# Patient Record
Sex: Female | Born: 1975
Health system: Southern US, Community
[De-identification: ages and names within clinical notes are randomized; demographics above are authoritative.]

## PROBLEM LIST (undated history)

## (undated) DIAGNOSIS — G4730 Sleep apnea, unspecified: Secondary | ICD-10-CM

## (undated) DIAGNOSIS — D649 Anemia, unspecified: Secondary | ICD-10-CM

## (undated) DIAGNOSIS — N390 Urinary tract infection, site not specified: Secondary | ICD-10-CM

## (undated) DIAGNOSIS — K5190 Ulcerative colitis, unspecified, without complications: Secondary | ICD-10-CM

## (undated) DIAGNOSIS — Z87898 Personal history of other specified conditions: Secondary | ICD-10-CM

## (undated) DIAGNOSIS — R030 Elevated blood-pressure reading, without diagnosis of hypertension: Secondary | ICD-10-CM

## (undated) DIAGNOSIS — I10 Essential (primary) hypertension: Secondary | ICD-10-CM

## (undated) DIAGNOSIS — M940 Chondrocostal junction syndrome [Tietze]: Secondary | ICD-10-CM

## (undated) DIAGNOSIS — K635 Polyp of colon: Secondary | ICD-10-CM

## (undated) DIAGNOSIS — F419 Anxiety disorder, unspecified: Secondary | ICD-10-CM

## (undated) HISTORY — PX: TUBAL LIGATION: SHX77

## (undated) HISTORY — DX: Anxiety disorder, unspecified: F41.9

## (undated) HISTORY — PX: COLONOSCOPY W/ POLYPECTOMY: SHX1380

## (undated) HISTORY — DX: Personal history of other specified conditions: Z87.898

## (undated) HISTORY — DX: Essential (primary) hypertension: I10

## (undated) HISTORY — DX: Urinary tract infection, site not specified: N39.0

## (undated) HISTORY — DX: Elevated blood-pressure reading, without diagnosis of hypertension: R03.0

## (undated) HISTORY — DX: Chondrocostal junction syndrome (tietze): M94.0

## (undated) HISTORY — DX: Polyp of colon: K63.5

## (undated) HISTORY — DX: Ulcerative colitis, unspecified, without complications: K51.90

---

## 2008-06-26 HISTORY — PX: FOOT SURGERY: SHX648

## 2015-07-01 ENCOUNTER — Encounter: Payer: Self-pay | Admitting: Family Medicine

## 2015-07-01 ENCOUNTER — Ambulatory Visit (INDEPENDENT_AMBULATORY_CARE_PROVIDER_SITE_OTHER): Payer: 59 | Admitting: Family Medicine

## 2015-07-01 VITALS — BP 170/100 | HR 68 | Temp 98.5°F | Wt 251.2 lb

## 2015-07-01 DIAGNOSIS — E669 Obesity, unspecified: Secondary | ICD-10-CM | POA: Diagnosis not present

## 2015-07-01 DIAGNOSIS — J0140 Acute pansinusitis, unspecified: Secondary | ICD-10-CM

## 2015-07-01 DIAGNOSIS — R030 Elevated blood-pressure reading, without diagnosis of hypertension: Secondary | ICD-10-CM

## 2015-07-01 DIAGNOSIS — IMO0001 Reserved for inherently not codable concepts without codable children: Secondary | ICD-10-CM

## 2015-07-01 MED ORDER — AMOXICILLIN 875 MG PO TABS: 875.0000 mg | ORAL_TABLET | Freq: Two times a day (BID) | ORAL | Status: DC

## 2015-07-01 NOTE — Progress Notes (Signed)
   Subjective:    Patient ID: Tracy Holt, female    DOB: 1975/11/18, 40 y.o.   MRN: [id number]  HPI Chief Complaint  Patient presents with  . sinus infection    sinus infection, headache, coughing, nose stopped up. sinus pressure   She is new to the practice and here for an acute visit.  She is here with complaints of low grade fever, frontal headache, sinus pressure and congestion, post nasal drainage for past 3-4 days. States she has tried OTC medications without relief.   States she has had a history of elevated blood pressures but never diagnosed or prescribed medication. She states she also checks her blood pressure outside of the doctor office and it is usually 160/90.  Denies ear pain, sore throat, chest pain, DOE, nausea, vomiting, diarrhea.   She states she lives in [facility] which is a 2 Hour drive from here and that she stays here during the week for work. She reports unhealthy eating due to travel and that she is an "emotional eater". She also does not currently exercise. States she has gained approximately 60 lbs in past 2 years since working in her new job. She wants to lose weight.  Has a husband and 2 children at home in [place].   Review of Systems Pertinent positives and negatives in the history of present illness.    Objective:   Physical Exam BP 170/100 mmHg  Pulse 68  Temp(Src) 98.5 F (36.9 C) (Oral)  Wt 251 lb 3.2 oz (113.944 kg)  Alert and in no distress. Mild frontal and maxillary sinus tenderness. Nares red. Tympanic membranes and canals are normal. Pharyngeal area is erythematous without edema or exudate Neck is supple without adenopathy or thyromegaly. Cardiac exam shows a regular sinus rhythm without murmurs or gallops. Lungs are clear to auscultation.      Assessment & Plan:  Acute pansinusitis, recurrence not specified - Plan: amoxicillin (AMOXIL) 875 MG tablet  Elevated blood pressure  She will return next week for blood pressure  check when she is no longer taking cold medication. DASH diet discussed as well as information on MyFitnessPal. Discussed lifestyle modifications including healthy eating and physical activity. Discussed that if her blood pressure remains elevated next week that we will need to start pharmacotherapy.  Discussed that she should make healthy food choices when eating fast food. She is on the road often and eating fast food. Discussed eating grilled foods and salads.  Suspect her URI symptoms are related to sinus infection. Discussed symptomatic treatment minus decongestants due to elevated blood pressure as well as prescribed amoxicillin. She will let me know if not improving or if she gets worse.

## 2015-07-01 NOTE — Patient Instructions (Signed)
My Fitness Pal is a free app.  Come by one day next week and have your blood pressure checked by the nurse. We will discuss the pressure medication if needed at that point.  DASH Eating Plan DASH stands for "Dietary Approaches to Stop Hypertension." The DASH eating plan is a healthy eating plan that has been shown to reduce high blood pressure (hypertension). Additional health benefits may include reducing the risk of type 2 diabetes mellitus, heart disease, and stroke. The DASH eating plan may also help with weight loss. WHAT DO I NEED TO KNOW ABOUT THE DASH EATING PLAN? For the DASH eating plan, you will follow these general guidelines:  Choose foods with a percent daily value for sodium of less than 5% (as listed on the food label).  Use salt-free seasonings or herbs instead of table salt or sea salt.  Check with your health care provider or pharmacist before using salt substitutes.  Eat lower-sodium products, often labeled as "lower sodium" or "no salt added."  Eat fresh foods.  Eat more vegetables, fruits, and low-fat dairy products.  Choose whole grains. Look for the word "whole" as the first word in the ingredient list.  Choose fish and skinless chicken or [place] more often than red meat. Limit fish, poultry, and meat to 6 oz (170 g) each day.  Limit sweets, desserts, sugars, and sugary drinks.  Choose heart-healthy fats.  Limit cheese to 1 oz (28 g) per day.  Eat more home-cooked food and less restaurant, buffet, and fast food.  Limit fried foods.  Cook foods using methods other than frying.  Limit canned vegetables. If you do use them, rinse them well to decrease the sodium.  When eating at a restaurant, ask that your food be prepared with less salt, or no salt if possible. WHAT FOODS CAN I EAT? Seek help from a dietitian for individual calorie needs. Grains Whole grain or whole wheat bread. Brown rice. Whole grain or whole wheat pasta. Quinoa, bulgur, and whole  grain cereals. Low-sodium cereals. Corn or whole wheat flour tortillas. Whole grain cornbread. Whole grain crackers. Low-sodium crackers. Vegetables Fresh or frozen vegetables (raw, steamed, roasted, or grilled). Low-sodium or reduced-sodium tomato and vegetable juices. Low-sodium or reduced-sodium tomato sauce and paste. Low-sodium or reduced-sodium canned vegetables.  Fruits All fresh, canned (in natural juice), or frozen fruits. Meat and Other Protein Products Ground beef (85% or leaner), grass-fed beef, or beef trimmed of fat. Skinless chicken or [place]. Ground chicken or [place]. Pork trimmed of fat. All fish and seafood. Eggs. Dried beans, peas, or lentils. Unsalted nuts and seeds. Unsalted canned beans. Dairy Low-fat dairy products, such as skim or 1% milk, 2% or reduced-fat cheeses, low-fat ricotta or cottage cheese, or plain low-fat yogurt. Low-sodium or reduced-sodium cheeses. Fats and Oils Tub margarines without trans fats. Light or reduced-fat mayonnaise and salad dressings (reduced sodium). Avocado. Safflower, olive, or canola oils. Natural peanut or almond butter. Other Unsalted popcorn and pretzels. The items listed above may not be a complete list of recommended foods or beverages. Contact your dietitian for more options. WHAT FOODS ARE NOT RECOMMENDED? Grains White bread. White pasta. White rice. Refined cornbread. Bagels and croissants. Crackers that contain trans fat. Vegetables Creamed or fried vegetables. Vegetables in a cheese sauce. Regular canned vegetables. Regular canned tomato sauce and paste. Regular tomato and vegetable juices. Fruits Dried fruits. Canned fruit in light or heavy syrup. Fruit juice. Meat and Other Protein Products Fatty cuts of meat. Ribs, chicken wings, bacon,  sausage, bologna, salami, chitterlings, fatback, hot dogs, bratwurst, and packaged luncheon meats. Salted nuts and seeds. Canned beans with salt. Dairy Whole or 2% milk, cream, half-and-half,  and cream cheese. Whole-fat or sweetened yogurt. Full-fat cheeses or blue cheese. Nondairy creamers and whipped toppings. Processed cheese, cheese spreads, or cheese curds. Condiments Onion and garlic salt, seasoned salt, table salt, and sea salt. Canned and packaged gravies. Worcestershire sauce. Tartar sauce. Barbecue sauce. Teriyaki sauce. Soy sauce, including reduced sodium. Steak sauce. Fish sauce. Oyster sauce. Cocktail sauce. Horseradish. Ketchup and mustard. Meat flavorings and tenderizers. Bouillon cubes. Hot sauce. Tabasco sauce. Marinades. Taco seasonings. Relishes. Fats and Oils Butter, stick margarine, lard, shortening, ghee, and bacon fat. Coconut, palm kernel, or palm oils. Regular salad dressings. Other Pickles and olives. Salted popcorn and pretzels. The items listed above may not be a complete list of foods and beverages to avoid. Contact your dietitian for more information. WHERE CAN I FIND MORE INFORMATION? National Heart, Lung, and Blood Institute: travelstabloid.com   This information is not intended to replace advice given to you by your health care provider. Make sure you discuss any questions you have with your health care provider.   Document Released: 06/01/2011 Document Revised: 07/03/2014 Document Reviewed: 04/16/2013 Elsevier Interactive Patient Education [employer].

## 2015-07-07 ENCOUNTER — Other Ambulatory Visit: Payer: 59

## 2015-07-07 ENCOUNTER — Encounter: Payer: Self-pay | Admitting: Family Medicine

## 2015-07-07 ENCOUNTER — Ambulatory Visit (INDEPENDENT_AMBULATORY_CARE_PROVIDER_SITE_OTHER): Payer: 59 | Admitting: Family Medicine

## 2015-07-07 VITALS — BP 132/94 | Ht 66.25 in | Wt 250.0 lb

## 2015-07-07 DIAGNOSIS — R350 Frequency of micturition: Secondary | ICD-10-CM | POA: Diagnosis not present

## 2015-07-07 DIAGNOSIS — R030 Elevated blood-pressure reading, without diagnosis of hypertension: Secondary | ICD-10-CM | POA: Diagnosis not present

## 2015-07-07 DIAGNOSIS — Z833 Family history of diabetes mellitus: Secondary | ICD-10-CM | POA: Diagnosis not present

## 2015-07-07 DIAGNOSIS — IMO0001 Reserved for inherently not codable concepts without codable children: Secondary | ICD-10-CM

## 2015-07-07 LAB — POCT URINALYSIS DIPSTICK
Bilirubin, UA: NEGATIVE
GLUCOSE UA: NEGATIVE
KETONES UA: NEGATIVE
Leukocytes, UA: NEGATIVE
Nitrite, UA: NEGATIVE
PROTEIN UA: NEGATIVE
RBC UA: NEGATIVE
UROBILINOGEN UA: NEGATIVE
pH, UA: 6

## 2015-07-07 LAB — COMPREHENSIVE METABOLIC PANEL
ALBUMIN: 4.2 g/dL (ref 3.6–5.1)
ALT: 19 U/L (ref 6–29)
AST: 18 U/L (ref 10–30)
Alkaline Phosphatase: 66 U/L (ref 33–115)
BILIRUBIN TOTAL: 0.5 mg/dL (ref 0.2–1.2)
BUN: 9 mg/dL (ref 7–25)
CO2: 27 mmol/L (ref 20–31)
CREATININE: 0.63 mg/dL (ref 0.50–1.10)
Calcium: 9.5 mg/dL (ref 8.6–10.2)
Chloride: 103 mmol/L (ref 98–110)
Glucose, Bld: 88 mg/dL (ref 65–99)
Potassium: 4.3 mmol/L (ref 3.5–5.3)
Sodium: 138 mmol/L (ref 135–146)
TOTAL PROTEIN: 7.2 g/dL (ref 6.1–8.1)

## 2015-07-07 LAB — CBC WITH DIFFERENTIAL/PLATELET
Basophils Absolute: 0 10*3/uL (ref 0.0–0.1)
Basophils Relative: 0 % (ref 0–1)
Eosinophils Absolute: 0.1 10*3/uL (ref 0.0–0.7)
Eosinophils Relative: 1 % (ref 0–5)
HEMATOCRIT: 41.9 % (ref 36.0–46.0)
HEMOGLOBIN: 14 g/dL (ref 12.0–15.0)
LYMPHS PCT: 32 % (ref 12–46)
Lymphs Abs: 3 10*3/uL (ref 0.7–4.0)
MCH: 29 pg (ref 26.0–34.0)
MCHC: 33.4 g/dL (ref 30.0–36.0)
MCV: 86.9 fL (ref 78.0–100.0)
MONO ABS: 0.6 10*3/uL (ref 0.1–1.0)
MONOS PCT: 6 % (ref 3–12)
MPV: 10.8 fL (ref 8.6–12.4)
NEUTROS ABS: 5.7 10*3/uL (ref 1.7–7.7)
Neutrophils Relative %: 61 % (ref 43–77)
Platelets: 290 10*3/uL (ref 150–400)
RBC: 4.82 MIL/uL (ref 3.87–5.11)
RDW: 13.8 % (ref 11.5–15.5)
WBC: 9.3 10*3/uL (ref 4.0–10.5)

## 2015-07-07 NOTE — Progress Notes (Signed)
   Subjective:    Patient ID: Tracy Holt, female    DOB: Mar 29, 1976, 40 y.o.   MRN: [id number]  HPI She was here initially for a nurse visit for follow up blood pressure check and her BP was elevated at 132/94.  Visit changed to appointment with me to further evaluation. Her blood pressure was elevated at her appointment last week. She has been checking her blood pressure outside of here for the past few days and numbers have been 165/100, 167/104, 163/98. She does not have a history of hypertension. No medical records. She cannot recall last physical exam and blood work.  Complains of urinary frequency for past several weeks, voiding every hour while awake. Denies dysuria, hematuria, nocturia.  She has family history, both parents and grandparents with type 2 diabetes.  She denies dizziness, headaches, visual changes, chest pain, palpitations, DOE, orthopnea, edema, or GI issues. Reports she is under a lot of stress with work and that she stays here in an apartment due to her job and then drives home to [facility] on the weekend to see her family. She has been doing this for a couple of years and in the foreseeable future does not have a plan to move here permanently or to change jobs. She states she does not exercise and does not watch what she eats.   Information for DASH diet was given to her last week, she states she has not paid attention to her food intake.  Review of Systems Pertinent positives and negatives in the history of present illness.    Objective:   Physical Exam BP 132/94 mmHg  Ht 5' 6.25" (1.683 m)  Wt 250 lb (113.399 kg)  BMI 40.04 kg/m2  Alert and oriented and in no acute distress.  EKG performed due to elevated blood pressure. NSR, unremarkable. Read by Dr. Redmond School. Urinalysis dipstick: Negative    Assessment & Plan:  Elevated blood pressure - Plan: EKG 12-Lead, CBC with Differential/Platelet, Comprehensive metabolic panel  Family history of diabetes  mellitus in first degree relative - Plan: Comprehensive metabolic panel  Frequency of urination - Plan: CBC with Differential/Platelet, Comprehensive metabolic panel, POCT urinalysis dipstick  Morbid obesity, unspecified obesity type ([place])  Discussed lifestyle modifications such as being more physically active even 5-10 minutes at a time can be beneficial. Also discussed eating a low-sodium diet and making healthy food choices such as grilled, baked, salads. Her EKG today was unremarkable and was read by me and Dr. Redmond School. Will check blood work and urinalysis due to recent urinary frequency. No evidence of target end organ damage at this point. Discussed lifestyle changes to improve obesity and blood pressure. She has significant family history for diabetes.  Discussed that standard of care to diagnose hypertension would be to bring her back in 3 weeks and repeat blood pressure. At that point, if her blood pressure remains elevated, she will need to start medication.

## 2015-07-07 NOTE — Patient Instructions (Signed)
I recommend being more physically active. Walking 5-10 minutes at a time a couple times per day would be beneficial. Make healthy food choices such as baked, grilled or salads. No more than 2,400 mg of sodium per day. These life style modifications can help reduce blood pressure. Please return in 3 weeks for blood pressure follow-up.

## 2015-07-28 ENCOUNTER — Encounter: Payer: Self-pay | Admitting: Family Medicine

## 2015-07-28 ENCOUNTER — Ambulatory Visit (INDEPENDENT_AMBULATORY_CARE_PROVIDER_SITE_OTHER): Payer: 59 | Admitting: Family Medicine

## 2015-07-28 VITALS — BP 142/100 | HR 68 | Temp 98.4°F | Wt 248.8 lb

## 2015-07-28 DIAGNOSIS — I10 Essential (primary) hypertension: Secondary | ICD-10-CM

## 2015-07-28 DIAGNOSIS — J3489 Other specified disorders of nose and nasal sinuses: Secondary | ICD-10-CM | POA: Diagnosis not present

## 2015-07-28 MED ORDER — ATENOLOL 50 MG PO TABS: 50.0000 mg | ORAL_TABLET | Freq: Every day | ORAL | Status: DC

## 2015-07-28 NOTE — Progress Notes (Signed)
   Subjective:    Patient ID: Tracy Holt, female    DOB: 11/22/1975, 40 y.o.   MRN: [id number]  HPI Chief Complaint  Patient presents with  . follow-up on bp    follow-up on bp. highest bp was 181/107, lowest bp 150/98 (monday)  . sick    sick for 2 days, running noses, sneezing, congestions, runny eyes.    She is here for follow-up on blood pressure. She reports taking her blood pressures at home since last visit and readings have been between 150/98 and 181/107. She reports eating low salt and healthier for past couple of weeks. She has started being more physically active in past week. She denies fever, chills, headaches, dizziness, chest pain, palpitations, DOE, LE edema. No plans for pregnancy, tubes are tied.  She states she is aware that she needs medication for blood pressure. Her goal is to eventually get off medication by modifying lifestyle.  States she already urinates often and does not want to be on a "fluid pill".   She also complains of runny nose and sneezing for past 2 days. Denies cough, sore throat, ear pain or sinus pressure.    Review of Systems Pertinent positives and negatives in the history of present illness.     Objective:   Physical Exam BP 142/100 mmHg  Pulse 68  Temp(Src) 98.4 F (36.9 C) (Oral)  Wt 248 lb 12.8 oz (112.855 kg)  Alert and in no distress. No sinus tenderness. Nares red, edematous and clear discharge. Tympanic membranes and canals are normal. Pharyngeal area is normal. Neck is supple without adenopathy.  Cardiac exam shows a regular sinus rhythm without murmurs or gallops. Lungs are clear to auscultation.  EKG and labs on file, baseline obtained at previous visit.      Assessment & Plan:  Essential hypertension - Plan: atenolol (TENORMIN) 50 MG tablet  Rhinorrhea  Discussed continuing lifestyle modifications such as healthy diet and increasing physical activity.  Atenolol 50 mg prescribed. Start by taking 25 mg, half tablet,  for first week, check blood pressures and pulse at home and let me know if pulse is below 60 or if she is having symptoms such as lightheadedness or severe fatigue. Advised her to avoid taking her first few doses and then driving afterwards.  Discussed that this medication must be tapered and not stopped abruptly going forward.  Rhinorrhea is most likely a viral etiology or possibly allergies, recommend trying an antihistamine such as claritin. She will let me know if her symptoms worsen.

## 2015-07-28 NOTE — Patient Instructions (Addendum)
Take 1/2 tab (25 mg) daily for first week and keep check on your blood pressure and heart rate. Let me know if you heart rate is dropping lower than 60 or if you feel lightheaded or symptomatic with the medication. We will increase the dose to the full tablet (50 mg) if you are doing well on the medication.  Return for blood pressure check in our office in 7-14 days.  Continue diet and exercise as discussed.    Atenolol tablets What is this medicine? ATENOLOL (a TEN oh lole) is a beta-blocker. Beta-blockers reduce the workload on the heart and help it to beat more regularly. This medicine is used to treat high blood pressure and to prevent chest pain. It is also used to protect the heart during a heart attack and to prevent an additional heart attack from occurring. This medicine may be used for other purposes; ask your health care provider or pharmacist if you have questions. What should I tell my health care provider before I take this medicine? They need to know if you have any of these conditions: -diabetes -heart or vessel disease like slow heart rate, worsening heart failure, heart block, sick sinus syndrome or Raynaud's disease -kidney disease -lung or breathing disease, like asthma or emphysema -pheochromocytoma -thyroid disease -an unusual or allergic reaction to atenolol, other beta-blockers, medicines, foods, dyes, or preservatives -pregnant or trying to get pregnant -breast-feeding How should I use this medicine? Take this medicine by mouth with a drink of water. Follow the directions on the prescription label. This medicine may be taken with or without food. Take your medicine at regular intervals. Do not take more medicine than directed. Do not stop taking this medicine suddenly. This could lead to serious heart-related effects. Talk to your pediatrician regarding the use of this medicine in children. Special care may be needed. Overdosage: If you think you have taken too much of  this medicine contact a poison control center or emergency room at once. NOTE: This medicine is only for you. Do not share this medicine with others. What if I miss a dose? If you miss a dose, take it as soon as you can. If it is almost time for your next dose, take only that dose. Do not take double or extra doses. What may interact with this medicine? This medicine may interact with the following medications: -certain medicines for blood pressure, heart disease, irregular heart beat -clonidine -digoxin -diuretics -dobutamine -epinephrine -isoproterenol -NSAIDs, medicines for pain and inflammation, like ibuprofen or naproxen -reserpine This list may not describe all possible interactions. Give your health care provider a list of all the medicines, herbs, non-prescription drugs, or dietary supplements you use. Also tell them if you smoke, drink alcohol, or use illegal drugs. Some items may interact with your medicine. What should I watch for while using this medicine? Visit your doctor or health care professional for regular check ups. Check your blood pressure and pulse rate regularly. Ask your health care professional what your blood pressure and pulse rate should be, and when you should contact him or her. You may get drowsy or dizzy. Do not drive, use machinery, or do anything that needs mental alertness until you know how this medicine affects you. Do not stand or sit up quickly. Alcohol may interfere with the effect of this medicine. Avoid alcoholic drinks. This medicine can affect blood sugar levels. If you have diabetes, check with your doctor or health care professional before you change your diet or  the dose of your diabetic medicine. Do not treat yourself for coughs, colds, or pain while you are taking this medicine without asking your doctor or health care professional for advice. Some ingredients may increase your blood pressure. What side effects may I notice from receiving this  medicine? Side effects that you should report to your doctor or health care professional as soon as possible: -allergic reactions like skin rash, itching or hives, swelling of the face, lips, or tongue -breathing problems -changes in vision -chest pain -cold, tingling, or numb hands or feet -depression -fast, irregular heartbeat -feeling faint or lightheaded, falls -fever with sore throat -rapid weight gain -swollen ankles, legs Side effects that usually do not require medical attention (report to your doctor or health care professional if they continue or are bothersome): -anxiety, nervous -diarrhea -dry skin -change in sex drive or performance -headache -nightmares or trouble sleeping -short term memory loss -stomach upset -unusually tired This list may not describe all possible side effects. Call your doctor for medical advice about side effects. You may report side effects to FDA at 1-800-FDA-1088. Where should I keep my medicine? Keep out of the reach of children. Store at room temperature between 20 and 25 degrees C (68 and 77 degrees F). Close tightly and protect from light. Throw away any unused medicine after the expiration date. NOTE: This sheet is a summary. It may not cover all possible information. If you have questions about this medicine, talk to your doctor, pharmacist, or health care provider.    2016, Elsevier/Gold Standard. (2013-02-16 14:21:28)

## 2015-08-11 ENCOUNTER — Ambulatory Visit (INDEPENDENT_AMBULATORY_CARE_PROVIDER_SITE_OTHER): Payer: 59 | Admitting: Family Medicine

## 2015-08-11 ENCOUNTER — Encounter: Payer: Self-pay | Admitting: Family Medicine

## 2015-08-11 VITALS — BP 132/92 | HR 68 | Wt 247.4 lb

## 2015-08-11 DIAGNOSIS — I10 Essential (primary) hypertension: Secondary | ICD-10-CM | POA: Diagnosis not present

## 2015-08-11 MED ORDER — LISINOPRIL 10 MG PO TABS: 10.0000 mg | ORAL_TABLET | Freq: Every day | ORAL | Status: DC

## 2015-08-11 NOTE — Progress Notes (Signed)
   Subjective:    Patient ID: Tracy Holt, female    DOB: 02/02/1976, 40 y.o.   MRN: [id number]  HPI Chief Complaint  Patient presents with  . follow-up    follow-up on bp.still taking a half tablet. brings on alittle depression and aniexty, sleepniess. bp lowest- 135/75, bp highest- 149/87   She was seen for continued elevated blood pressure and started on beta blocker approximately 2 weeks ago. She is taking atenolol at 8pm in the evening, still taking 1/2 pill and did not increase dose after the first week due to having some side effects from the medication. She states her blood pressure has been from 135/and heart rate has been in 60s-80s on the medication Reports having trouble falling asleep, feeling blue and having depressive type symptoms. States she has not felt depressed in many years. Also reports dry mouth and feeling some anxiety in public. She is interested in switching medication.  She does not want to take a diuretic, she has to drive often for work.  No plans for pregnancy, has had tubal ligation.  Denies chest pain, palpitations, DOE or LE edema.  States she has made some mild changes to her diet such as eating more grilled food and salads when she can. Has not started exercising as of yet.   No physical exam in 5 years and would like TSH checked due to mother having history of Graves disease. She states she is not having any skin, hair or bowel changes.   Review of Systems Pertinent positives and negatives in the history of present illness.     Objective:   Physical Exam BP 132/92 mmHg  Pulse 68  Wt 247 lb 6.4 oz (112.22 kg) Alert and in no distress. Cardiac exam shows a regular sinus rhythm without murmurs or gallops. Lungs are clear to auscultation.     Assessment & Plan:  Essential hypertension - Plan: lisinopril (PRINIVIL,ZESTRIL) 10 MG tablet  Discussed patient with Dr. Tomi Bamberger. Since patient is having significant side effects, I recommend that we switch her  medication. No tapering needed since patient has only been taking 23m of Atenolol for 2 weeks. She took atenolol this morning before appointment. Recommend stopping atenolol and start Lisinopril 10 mg tomorrow. Discussed possible side effects and to report is she is having palpitations, cough. Also discussed seeking emergency medial care if she has facial or lip edema which is rare.  Discussed again the Dash diet and starting to exercise. She will return in 2 weeks for a nurse visit to have her blood pressure checked here in the office. She will also continue checking her blood pressure at home and report if her readings are greater than 130/90. Also encouraged her to call [place] she is experiencing side effects that she thinks is caused by the medication. She has not had a physical exam and fasting blood work in several years and I recommend that she schedule this in the near future.

## 2015-08-11 NOTE — Patient Instructions (Signed)
DASH Eating Plan  DASH stands for "Dietary Approaches to Stop Hypertension." The DASH eating plan is a healthy eating plan that has been shown to reduce high blood pressure (hypertension). Additional health benefits may include reducing the risk of type 2 diabetes mellitus, heart disease, and stroke. The DASH eating plan may also help with weight loss.  WHAT DO I NEED TO KNOW ABOUT THE DASH EATING PLAN?  For the DASH eating plan, you will follow these general guidelines:  · Choose foods with a percent daily value for sodium of less than 5% (as listed on the food label).  · Use salt-free seasonings or herbs instead of table salt or sea salt.  · Check with your health care provider or pharmacist before using salt substitutes.  · Eat lower-sodium products, often labeled as "lower sodium" or "no salt added."  · Eat fresh foods.  · Eat more vegetables, fruits, and low-fat dairy products.  · Choose whole grains. Look for the word "whole" as the first word in the ingredient list.  · Choose fish and skinless chicken or turkey more often than red meat. Limit fish, poultry, and meat to 6 oz (170 g) each day.  · Limit sweets, desserts, sugars, and sugary drinks.  · Choose heart-healthy fats.  · Limit cheese to 1 oz (28 g) per day.  · Eat more home-cooked food and less restaurant, buffet, and fast food.  · Limit fried foods.  · Cook foods using methods other than frying.  · Limit canned vegetables. If you do use them, rinse them well to decrease the sodium.  · When eating at a restaurant, ask that your food be prepared with less salt, or no salt if possible.  WHAT FOODS CAN I EAT?  Seek help from a dietitian for individual calorie needs.  Grains  Whole grain or whole wheat bread. Brown rice. Whole grain or whole wheat pasta. Quinoa, bulgur, and whole grain cereals. Low-sodium cereals. Corn or whole wheat flour tortillas. Whole grain cornbread. Whole grain crackers. Low-sodium crackers.  Vegetables  Fresh or frozen vegetables  (raw, steamed, roasted, or grilled). Low-sodium or reduced-sodium tomato and vegetable juices. Low-sodium or reduced-sodium tomato sauce and paste. Low-sodium or reduced-sodium canned vegetables.   Fruits  All fresh, canned (in natural juice), or frozen fruits.  Meat and Other Protein Products  Ground beef (85% or leaner), grass-fed beef, or beef trimmed of fat. Skinless chicken or turkey. Ground chicken or turkey. Pork trimmed of fat. All fish and seafood. Eggs. Dried beans, peas, or lentils. Unsalted nuts and seeds. Unsalted canned beans.  Dairy  Low-fat dairy products, such as skim or 1% milk, 2% or reduced-fat cheeses, low-fat ricotta or cottage cheese, or plain low-fat yogurt. Low-sodium or reduced-sodium cheeses.  Fats and Oils  Tub margarines without trans fats. Light or reduced-fat mayonnaise and salad dressings (reduced sodium). Avocado. Safflower, olive, or canola oils. Natural peanut or almond butter.  Other  Unsalted popcorn and pretzels.  The items listed above may not be a complete list of recommended foods or beverages. Contact your dietitian for more options.  WHAT FOODS ARE NOT RECOMMENDED?  Grains  White bread. White pasta. White rice. Refined cornbread. Bagels and croissants. Crackers that contain trans fat.  Vegetables  Creamed or fried vegetables. Vegetables in a cheese sauce. Regular canned vegetables. Regular canned tomato sauce and paste. Regular tomato and vegetable juices.  Fruits  Dried fruits. Canned fruit in light or heavy syrup. Fruit juice.  Meat and Other Protein   Products  Fatty cuts of meat. Ribs, chicken wings, bacon, sausage, bologna, salami, chitterlings, fatback, hot dogs, bratwurst, and packaged luncheon meats. Salted nuts and seeds. Canned beans with salt.  Dairy  Whole or 2% milk, cream, half-and-half, and cream cheese. Whole-fat or sweetened yogurt. Full-fat cheeses or blue cheese. Nondairy creamers and whipped toppings. Processed cheese, cheese spreads, or cheese  curds.  Condiments  Onion and garlic salt, seasoned salt, table salt, and sea salt. Canned and packaged gravies. Worcestershire sauce. Tartar sauce. Barbecue sauce. Teriyaki sauce. Soy sauce, including reduced sodium. Steak sauce. Fish sauce. Oyster sauce. Cocktail sauce. Horseradish. Ketchup and mustard. Meat flavorings and tenderizers. Bouillon cubes. Hot sauce. Tabasco sauce. Marinades. Taco seasonings. Relishes.  Fats and Oils  Butter, stick margarine, lard, shortening, ghee, and bacon fat. Coconut, palm kernel, or palm oils. Regular salad dressings.  Other  Pickles and olives. Salted popcorn and pretzels.  The items listed above may not be a complete list of foods and beverages to avoid. Contact your dietitian for more information.  WHERE CAN I FIND MORE INFORMATION?  National Heart, Lung, and Blood Institute: www.nhlbi.nih.gov/health/health-topics/topics/dash/     This information is not intended to replace advice given to you by your health care provider. Make sure you discuss any questions you have with your health care provider.     Document Released: 06/01/2011 Document Revised: 07/03/2014 Document Reviewed: 04/16/2013  Elsevier Interactive Patient Education ©2016 Elsevier Inc.

## 2015-08-31 ENCOUNTER — Telehealth: Payer: Self-pay

## 2015-08-31 ENCOUNTER — Other Ambulatory Visit: Payer: 59

## 2015-08-31 DIAGNOSIS — I10 Essential (primary) hypertension: Secondary | ICD-10-CM

## 2015-08-31 MED ORDER — LISINOPRIL 20 MG PO TABS
20.0000 mg | ORAL_TABLET | Freq: Every day | ORAL | Status: DC
Start: 2015-08-31 — End: 2016-07-05

## 2015-08-31 NOTE — Telephone Encounter (Signed)
done

## 2015-08-31 NOTE — Addendum Note (Signed)
Addended by: Billie Lade on: 08/31/2015 12:44 PM   Modules accepted: Orders, Medications

## 2015-08-31 NOTE — Telephone Encounter (Signed)
Please send a new prescription for lisinopril 20 mg to her pharmacy. 1 refill only

## 2015-08-31 NOTE — Telephone Encounter (Signed)
Pt came in today for bp check and she states she took her medicine at 615am. Her bp was 140/92. She gave previous readings: 123/90 134/92 135/87 150/87 140/88  Told pt to double her Lisinopril starting tomorrow and once she runs out to pick up new rx from pharmacy. Pt will call back Friday with an update on taking the new dosage

## 2015-09-07 ENCOUNTER — Encounter: Payer: Self-pay | Admitting: Family Medicine

## 2015-10-15 ENCOUNTER — Telehealth: Payer: Self-pay | Admitting: Family Medicine

## 2015-10-15 NOTE — Telephone Encounter (Signed)
Received signed request to fax records to [facility]. Records faxed to [phone].

## 2015-12-23 ENCOUNTER — Encounter: Payer: Self-pay | Admitting: Family Medicine

## 2015-12-29 ENCOUNTER — Ambulatory Visit (INDEPENDENT_AMBULATORY_CARE_PROVIDER_SITE_OTHER): Payer: BLUE CROSS/BLUE SHIELD | Admitting: Family Medicine

## 2015-12-29 ENCOUNTER — Encounter: Payer: Self-pay | Admitting: Family Medicine

## 2015-12-29 VITALS — BP 118/72 | HR 64 | Ht 65.5 in | Wt 253.2 lb

## 2015-12-29 DIAGNOSIS — I10 Essential (primary) hypertension: Secondary | ICD-10-CM | POA: Insufficient documentation

## 2015-12-29 DIAGNOSIS — Z87898 Personal history of other specified conditions: Secondary | ICD-10-CM | POA: Diagnosis not present

## 2015-12-29 DIAGNOSIS — Z86018 Personal history of other benign neoplasm: Secondary | ICD-10-CM

## 2015-12-29 DIAGNOSIS — Z0000 Encounter for general adult medical examination without abnormal findings: Secondary | ICD-10-CM | POA: Insufficient documentation

## 2015-12-29 DIAGNOSIS — Z8349 Family history of other endocrine, nutritional and metabolic diseases: Secondary | ICD-10-CM | POA: Diagnosis not present

## 2015-12-29 DIAGNOSIS — Z124 Encounter for screening for malignant neoplasm of cervix: Secondary | ICD-10-CM

## 2015-12-29 DIAGNOSIS — Z23 Encounter for immunization: Secondary | ICD-10-CM

## 2015-12-29 HISTORY — DX: Essential (primary) hypertension: I10

## 2015-12-29 LAB — CBC WITH DIFFERENTIAL/PLATELET
BASOS PCT: 0 %
Basophils Absolute: 0 cells/uL (ref 0–200)
EOS ABS: 78 {cells}/uL (ref 15–500)
Eosinophils Relative: 1 %
HEMATOCRIT: 41.7 % (ref 35.0–45.0)
HEMOGLOBIN: 13.8 g/dL (ref 11.7–15.5)
LYMPHS ABS: 2574 {cells}/uL (ref 850–3900)
Lymphocytes Relative: 33 %
MCH: 29.1 pg (ref 27.0–33.0)
MCHC: 33.1 g/dL (ref 32.0–36.0)
MCV: 87.8 fL (ref 80.0–100.0)
MONO ABS: 546 {cells}/uL (ref 200–950)
MPV: 10.5 fL (ref 7.5–12.5)
Monocytes Relative: 7 %
NEUTROS ABS: 4602 {cells}/uL (ref 1500–7800)
Neutrophils Relative %: 59 %
Platelets: 289 10*3/uL (ref 140–400)
RBC: 4.75 MIL/uL (ref 3.80–5.10)
RDW: 13.3 % (ref 11.0–15.0)
WBC: 7.8 10*3/uL (ref 4.0–10.5)

## 2015-12-29 LAB — POCT URINALYSIS DIPSTICK
BILIRUBIN UA: NEGATIVE
GLUCOSE UA: NEGATIVE
Ketones, UA: NEGATIVE
LEUKOCYTES UA: NEGATIVE
NITRITE UA: NEGATIVE
Protein, UA: NEGATIVE
RBC UA: NEGATIVE
Spec Grav, UA: 1.015
Urobilinogen, UA: NEGATIVE
pH, UA: 7

## 2015-12-29 LAB — TSH: TSH: 2.72 mIU/L

## 2015-12-29 NOTE — Patient Instructions (Addendum)
Per request, I put in a referral for you to see a Gynecologist for pap smear/pelvic exam. I recommend scheduling this in the next few weeks. Some offices are listed below.    [facility] Address: [address] [place], [place] [address] Phone: [phone]  [place], [place] [address] [phone]  [place], [place] [address] Phone: [phone]  [facility] Address: [address], [place], [place] [address] Phone: [phone]   I also ecommend a dental exam.  Your received your Tdap today.     Preventative Care for Adults - Female      MAINTAIN REGULAR HEALTH EXAMS:  A routine yearly physical is a good way to check in with your primary care provider about your health and preventive screening. It is also an opportunity to share updates about your health and any concerns you have, and receive a thorough all-over exam.   Most health insurance companies pay for at least some preventative services.  Check with your health plan for specific coverages.  WHAT PREVENTATIVE SERVICES DO WOMEN NEED?  Adult women should have their weight and blood pressure checked regularly.   Women age 1 and older should have their cholesterol levels checked regularly.  Women should be screened for cervical cancer with a Pap smear and pelvic exam beginning at either age 65, or 3 years after they become sexually activity.    Breast cancer screening generally begins at age 52 with a mammogram and breast exam by your primary care provider.    Beginning at age 87 and continuing to age 78, women should be screened for colorectal cancer.  Certain people may need continued testing until age 39.  Updating vaccinations is part of preventative care.  Vaccinations help protect against diseases such as the flu.  Osteoporosis is a disease in which the bones lose minerals and strength as we age.  Women ages 85 and over should discuss this with their caregivers, as should women after menopause who have other risk factors.  Lab tests are generally done as part of preventative care to screen for anemia and blood disorders, to screen for problems with the kidneys and liver, to screen for bladder problems, to check blood sugar, and to check your cholesterol level.  Preventative services generally include counseling about diet, exercise, avoiding tobacco, drugs, excessive alcohol consumption, and sexually transmitted infections.    GENERAL RECOMMENDATIONS FOR GOOD HEALTH:  Healthy diet:  Eat a variety of foods, including fruit, vegetables, animal or vegetable protein, such as meat, fish, chicken, and eggs, or beans, lentils, tofu, and grains, such as rice.  Drink plenty of water daily.  Decrease saturated fat in the diet, avoid lots of red meat, processed foods, sweets, fast foods, and fried foods.  Exercise:  Aerobic exercise helps maintain good heart health. At least 30-40 minutes of moderate-intensity exercise is recommended. For example, a brisk walk that increases your heart rate and breathing. This should be done on most days of the week.   Find a type of exercise or a variety of exercises that you enjoy so that it becomes a part of your daily life.  Examples are running, walking, swimming, water aerobics, and biking.  For motivation and support, explore group exercise such as aerobic class, spin class, Zumba, Yoga,or  martial arts, etc.    Set exercise goals for yourself, such as a certain weight goal, walk or run in  a race such as a 5k walk/run.  Speak to your primary care provider about exercise goals.  Disease prevention:  If you smoke or chew tobacco, find out from your caregiver how to quit. It can literally save your life, no matter how long you have been a tobacco user. If you do not use tobacco, never begin.   Maintain a healthy diet and normal weight. Increased weight  leads to problems with blood pressure and diabetes.   The Body Mass Index or BMI is a way of measuring how much of your body is fat. Having a BMI above 27 increases the risk of heart disease, diabetes, hypertension, stroke and other problems related to obesity. Your caregiver can help determine your BMI and based on it develop an exercise and dietary program to help you achieve or maintain this important measurement at a healthful level.  High blood pressure causes heart and blood vessel problems.  Persistent high blood pressure should be treated with medicine if weight loss and exercise do not work.   Fat and cholesterol leaves deposits in your arteries that can block them. This causes heart disease and vessel disease elsewhere in your body.  If your cholesterol is found to be high, or if you have heart disease or certain other medical conditions, then you may need to have your cholesterol monitored frequently and be treated with medication.   Ask if you should have a cardiac stress test if your history suggests this. A stress test is a test done on a treadmill that looks for heart disease. This test can find disease prior to there being a problem.  Menopause can be associated with physical symptoms and risks. Hormone replacement therapy is available to decrease these. You should talk to your caregiver about whether starting or continuing to take hormones is right for you.   Osteoporosis is a disease in which the bones lose minerals and strength as we age. This can result in serious bone fractures. Risk of osteoporosis can be identified using a bone density scan. Women ages 60 and over should discuss this with their caregivers, as should women after menopause who have other risk factors. Ask your caregiver whether you should be taking a calcium supplement and Vitamin D, to reduce the rate of osteoporosis.   Avoid drinking alcohol in excess (more than two drinks per day).  Avoid use of street drugs. Do  not share needles with anyone. Ask for professional help if you need assistance or instructions on stopping the use of alcohol, cigarettes, and/or drugs.  Brush your teeth twice a day with fluoride toothpaste, and floss once a day. Good oral hygiene prevents tooth decay and gum disease. The problems can be painful, unattractive, and can cause other health problems. Visit your dentist for a routine oral and dental check up and preventive care every 6-12 months.   Look at your skin regularly.  Use a mirror to look at your back. Notify your caregivers of changes in moles, especially if there are changes in shapes, colors, a size larger than a pencil eraser, an irregular border, or development of new moles.  Safety:  Use seatbelts 100% of the time, whether driving or as a passenger.  Use safety devices such as hearing protection if you work in environments with loud noise or significant background noise.  Use safety glasses when doing any work that could send debris in to the eyes.  Use a helmet if you ride a bike or motorcycle.  Use appropriate  safety gear for contact sports.  Talk to your caregiver about gun safety.  Use sunscreen with a SPF (or skin protection factor) of 15 or greater.  Lighter skinned people are at a greater risk of skin cancer. Don't forget to also wear sunglasses in order to protect your eyes from too much damaging sunlight. Damaging sunlight can accelerate cataract formation.   Practice safe sex. Use condoms. Condoms are used for birth control and to help reduce the spread of sexually transmitted infections (or STIs).  Some of the STIs are gonorrhea (the clap), chlamydia, syphilis, trichomonas, herpes, HPV (human papilloma virus) and HIV (human immunodeficiency virus) which causes AIDS. The herpes, HIV and HPV are viral illnesses that have no cure. These can result in disability, cancer and death.   Keep carbon monoxide and smoke detectors in your home functioning at all times.  Change the batteries every 6 months or use a model that plugs into the wall.   Vaccinations:  Stay up to date with your tetanus shots and other required immunizations. You should have a booster for tetanus every 10 years. Be sure to get your flu shot every year, since 5%-20% of the [place] population comes down with the flu. The flu vaccine changes each year, so being vaccinated once is not enough. Get your shot in the fall, before the flu season peaks.   Other vaccines to consider:  Human Papilloma Virus or HPV causes cancer of the cervix, and other infections that can be transmitted from person to person. There is a vaccine for HPV, and females should get immunized between the ages of 85 and 44. It requires a series of 3 shots.   Pneumococcal vaccine to protect against certain types of pneumonia.  This is normally recommended for adults age 65 or older.  However, adults younger than 39 years old with certain underlying conditions such as diabetes, heart or lung disease should also receive the vaccine.  Shingles vaccine to protect against Varicella Zoster if you are older than age 41, or younger than 39 years old with certain underlying illness.  Hepatitis A vaccine to protect against a form of infection of the liver by a virus acquired from food.  Hepatitis B vaccine to protect against a form of infection of the liver by a virus acquired from blood or body fluids, particularly if you work in health care.  If you plan to travel internationally, check with your local health department for specific vaccination recommendations.  Cancer Screening:  Breast cancer screening is essential to preventive care for women. All women age 63 and older should perform a breast self-exam every month. At age 52 and older, women should have their caregiver complete a breast exam each year. Women at ages 68 and older should have a mammogram (x-ray film) of the breasts. Your caregiver can discuss how often you need  mammograms.    Cervical cancer screening includes taking a Pap smear (sample of cells examined under a microscope) from the cervix (end of the uterus). It also includes testing for HPV (Human Papilloma Virus, which can cause cervical cancer). Screening and a pelvic exam should begin at age 2, or 3 years after a woman becomes sexually active. Screening should occur every year, with a Pap smear but no HPV testing, up to age 27. After age 80, you should have a Pap smear every 3 years with HPV testing, if no HPV was found previously.   Most routine colon cancer screening begins at the age of  50. On a yearly basis, doctors may provide special easy to use take-home tests to check for hidden blood in the stool. Sigmoidoscopy or colonoscopy can detect the earliest forms of colon cancer and is life saving. These tests use a small camera at the end of a tube to directly examine the colon. Speak to your caregiver about this at age 30, when routine screening begins (and is repeated every 5 years unless early forms of pre-cancerous polyps or small growths are found).

## 2015-12-29 NOTE — Progress Notes (Signed)
Subjective:    Patient ID: Tracy Holt, female    DOB: Sep 11, 1975, 40 y.o.   MRN: [id number]  HPI Chief Complaint  Patient presents with  . fasting cpe    fasting cpe, needs letter for surgery, tsh    She is here for a complete physical exam. She is also interested in weight loss surgery and requesting that I clear her from a medical standpoint for this. She brought with her lab results from April that was done through work. She states she needs a TSH for surgery clearance.   Last CPE: years  Other providers: [facility] Dr. Olam Idler bariatric surgery- went through the educational program  Past medical history: HTN, ulcerative colitis- did remicade infusions in past, controlling with diet.  Surgeries: 2 C-section, tubal ligation, plantar fascitis.   Mental Health History: no addiction issues. History of situational depression. Denies any concerns with anxiety or depression.   She states her weight is making her feel overall "tired" and she has been noticing shortness of breath with exertion, such as walking up stairs.  In the past she has tried multiple weight loss therapies:  Weight watchers, Rickard Patience,  HCG injections Atkins diet  Weight loss medications such as phentermine. Beach body diet- reports she had a 60 lbs weight loss and gained all back plus 10 Trend of weight loss but gain  Social history: Lives alone during week, with husband on weekends. She has been commuting on Mondays and Fridays due to working in [place] but living in [place].  works as [employer], no college in past but is contemplating going.  Former smoker, drinks alcohol rarely, denies drug use  Diet: unhealthy diet, fast food.  Excerise: not particularly  Immunizations: tetanus >10 years.   Health maintenance:  Mammogram: diagnostic for benign mass 2009.  states she was told to tart at age 49.  Colonoscopy: 2 in past, last one 2009 for [facility] Last Gynecological Exam:  unknown Pap smear: several years wants to be referred to a gynecologist Last Menstrual cycle: 2007- uterine ablation to correct iron def anemia Pregnancies: 2 Last Dental Exam: 3 years ago. Plans on doing this soon.  Last Eye Exam: 2-3 years ago. No issues.   Wears seatbelt always, uses sunscreen, smoke detectors in home and functioning, does not text while driving and feels safe in home environment.   Reviewed allergies, medications, past medical, surgical, family, and social history.    Review of Systems Review of Systems Constitutional: -fever, -chills, -sweats, -unexpected weight change,-fatigue ENT: -runny nose, -ear pain, -sore throat Cardiology:  -chest pain, -palpitations, -edema Respiratory: -cough, -shortness of breath, -wheezing Gastroenterology: -abdominal pain, -nausea, -vomiting, -diarrhea, -constipation  Hematology: -bleeding or bruising problems Musculoskeletal: -arthralgias, -myalgias, -joint swelling, -back pain Ophthalmology: -vision changes Urology: -dysuria, -difficulty urinating, -hematuria, -urinary frequency, -urgency Neurology: -headache, -weakness, -tingling, -numbness       Objective:   Physical Exam BP 118/72 mmHg  Pulse 64  Ht 5' 5.5" (1.664 m)  Wt 253 lb 3.2 oz (114.851 kg)  BMI 41.48 kg/m2  General Appearance:    Alert, cooperative, no distress, appears stated age  Head:    Normocephalic, without obvious abnormality, atraumatic  Eyes:    PERRL, conjunctiva/corneas clear, EOM's intact, fundi    benign  Ears:    Normal TM's and external ear canals  Nose:   Nares normal, mucosa normal, no drainage or sinus   tenderness  Throat:   Lips, mucosa, and tongue normal; teeth and  gums normal  Neck:   Supple, no lymphadenopathy;  thyroid:  no   enlargement/tenderness/nodules; no carotid   bruit or JVD  Back:    Spine nontender, no curvature, ROM normal, no CVA     tenderness  Lungs:     Clear to auscultation bilaterally without wheezes, rales or      ronchi; respirations unlabored  Chest Wall:    No tenderness or deformity   Heart:    Regular rate and rhythm, S1 and S2 normal, no murmur, rub   or gallop  Breast Exam:    Refused, would like referral to gynecologist. No axillary lymphadenopathy  Abdomen:     Soft, non-tender, nondistended, normoactive bowel sounds,    no masses, no hepatosplenomegaly  Genitalia:    Refused, would like referral to gynecologist.   Rectal:    Not performed due to age<40 and no related complaints  Extremities:   No clubbing, cyanosis or edema  Pulses:   2+ and symmetric all extremities  Skin:   Skin color, texture, turgor normal, no rashes or lesions  Lymph nodes:   Cervical, supraclavicular, and axillary nodes normal  Neurologic:   CNII-XII intact, normal strength, sensation and gait; reflexes 2+ and symmetric throughout          Psych:   Normal mood, affect, hygiene and grooming.    Urinalysis dipstick: negative     Assessment & Plan:  Routine general medical examination at a health care facility - Plan: POCT urinalysis dipstick, CBC with Differential/Platelet  Morbid obesity, unspecified obesity type ([place]) - Plan: TSH  Essential hypertension  Family history of thyroid disease in mother - Plan: TSH  Need for Tdap vaccination - Plan: Tdap vaccine greater than or equal to 7yo IM  Screening for cervical cancer - Plan: Ambulatory referral to Obstetrics / Gynecology  History of benign breast tumor - Plan: Ambulatory referral to Obstetrics / Gynecology  HTN- blood pressure within goal. No changes to medications needed.  Morbid Obesity- she appears to have tried multiple diets and medications for weight loss and has had success but always had rebound weight gain.  She would like to move forward with possible weight loss surgery and I think it is reasonable choice. I will clear her for weight loss surgery pending lab results. Will provide letter for her to take to [facility].  She declines  pelvic exam, pap smear and breast exam today and would like to be referred to a gynecologist for this. I recommend that she schedule for a pap smear in the next few weeks since she is overdue and discussed cervical cancer screening. Referral made.  Discussed safety and health promotion. Recommend that she schedule a dental exam in the near future since she is overdue for this. Tdap given today.  Follow-up in 6 months for hypertension or sooner if needed.

## 2016-01-24 DIAGNOSIS — M25562 Pain in left knee: Secondary | ICD-10-CM | POA: Diagnosis not present

## 2016-02-11 DIAGNOSIS — M25562 Pain in left knee: Secondary | ICD-10-CM | POA: Diagnosis not present

## 2016-03-29 DIAGNOSIS — D2372 Other benign neoplasm of skin of left lower limb, including hip: Secondary | ICD-10-CM | POA: Diagnosis not present

## 2016-03-29 DIAGNOSIS — D492 Neoplasm of unspecified behavior of bone, soft tissue, and skin: Secondary | ICD-10-CM | POA: Diagnosis not present

## 2016-07-05 ENCOUNTER — Ambulatory Visit (INDEPENDENT_AMBULATORY_CARE_PROVIDER_SITE_OTHER): Payer: BLUE CROSS/BLUE SHIELD | Admitting: Family Medicine

## 2016-07-05 ENCOUNTER — Encounter: Payer: Self-pay | Admitting: Family Medicine

## 2016-07-05 VITALS — BP 138/82 | HR 78 | Ht 66.75 in | Wt 244.4 lb

## 2016-07-05 DIAGNOSIS — I10 Essential (primary) hypertension: Secondary | ICD-10-CM

## 2016-07-05 DIAGNOSIS — G8929 Other chronic pain: Secondary | ICD-10-CM | POA: Diagnosis not present

## 2016-07-05 DIAGNOSIS — M25562 Pain in left knee: Secondary | ICD-10-CM

## 2016-07-05 DIAGNOSIS — E669 Obesity, unspecified: Secondary | ICD-10-CM

## 2016-07-05 MED ORDER — LISINOPRIL-HYDROCHLOROTHIAZIDE 20-12.5 MG PO TABS: 1.0000 | ORAL_TABLET | Freq: Every day | ORAL | 2 refills | Status: DC

## 2016-07-05 NOTE — Progress Notes (Addendum)
Subjective:    Patient ID: Tracy Holt, female    DOB: [birth year], 41 y.o.   MRN: [id number]  HPI Chief Complaint  Patient presents with  . Med check    med check- bp is highest- 129/104 lowest- 111/85   She is here for a 6 month med check. We are following up on HTN. Reports she is taking mediation daily and no issues with compliance.  States she is checking her blood pressures at home and the majority of her readings have been 140s/90s. .  She is no longer planning to have weight loss due to insurance regulations but will keep the option open for future.   Diet - she has cut out soda. Eats out a lot, every day for dinner. Still traveling back and forth for work.  Exercise - nothing.   Complains of left knee pain for past 7 months. Progressively worsening. Pain is sharp and worse with walking and improves with rest. Reports having a dull ache even with rest.  Has seen an orthopedist in [facility] since pain started and had an XR of the knee. States she was put in a knee brace, states she has different braces and these do not help.   Is taking Naproxen and ibuprofen.  No locking, popping but feels like her knee might "give away" when going up and down steps.  Denies injury or history of surgery to her left knee.   She requested a referral to OB/GYN for a pap smear/pelvic and breast exam at her CPE last year. She states she did not go. She plans to go.   LMP: years ago, had an ablation  Contraception: tubal ligation   Past Medical History:  Diagnosis Date  . Colonic polyp   . Costochondritis   . Depression   . Elevated blood pressure (not hypertension)   . History of lump in breast   . Hypertension   . UC (ulcerative colitis) [facility])    Past Surgical History:  Procedure Laterality Date  . CESAREAN SECTION    . COLONOSCOPY W/ POLYPECTOMY    . TUBAL LIGATION     Reviewed allergies, medications, past medical, surgical, and social history.    Review of Systems Pertinent  positives and negatives in the history of present illness.     Objective:   Physical Exam  Constitutional: She is oriented to person, place, and time. She appears well-developed and well-nourished. No distress.  Cardiovascular: Normal rate, regular rhythm, normal heart sounds and intact distal pulses.  Exam reveals no gallop and no friction rub.   No murmur heard. Pulmonary/Chest: Effort normal and breath sounds normal.  Musculoskeletal:       Left knee: Normal. She exhibits normal range of motion, no swelling, no erythema and normal patellar mobility. No tenderness found.  Neurological: She is alert and oriented to person, place, and time.  Skin: Skin is warm and dry. No pallor.  Psychiatric: She has a normal mood and affect. Her behavior is normal. Judgment and thought content normal.   BP 138/82   Pulse 78   Ht 5' 6.75" (1.695 m)   Wt 244 lb 6.4 oz (110.9 kg)   BMI 38.57 kg/m      Assessment & Plan:  Essential hypertension - Plan: lisinopril-hydrochlorothiazide (PRINZIDE,ZESTORETIC) 20-12.5 MG tablet  Chronic pain of left knee  Obesity (BMI 30-39.9)  Discussed that her blood pressure is not within goal and recommend changing her current medication. Discontinue lisinopril and start lisinopril/HCTZ. She will keep  an eye on her BP at home and let me know is she is having any concerns. Discussed goal BP range is <130/80.  Congratulated her on 9 lb weight loss. Counseled on DASH diet.  Discussed that her knee exam is unremarkable. Plan to have her continue with conservative therapy for now and get her records from the orthopedist and XR of her knee.  Refused Flu shot. Follow up in 1 month for HTN. She will need to have fasting lipids checked. CPE in July/August.

## 2016-07-05 NOTE — Patient Instructions (Addendum)
Stop taking Lisinopril and start the new prescription of Lisinopril/HCTZ.  Check your blood pressure daily for the next 2 weeks and see what your readings are. Let me know if you are seeing any low pressures or having side effects.  I would like for you BP to be <130/80 ideally. It may take a few weeks to get there.   Continue with rest, ice, elevation and anti-inflammatories for your knee and let's get the records and see what they show.   Schedule your gynecologist appointment.   Return in 4-6 weeks for a follow up appointment on hypertension.  You will be due for an annual exam in mid July.    DASH Eating Plan DASH stands for "Dietary Approaches to Stop Hypertension." The DASH eating plan is a healthy eating plan that has been shown to reduce high blood pressure (hypertension). Additional health benefits may include reducing the risk of type 2 diabetes mellitus, heart disease, and stroke. The DASH eating plan may also help with weight loss. What do I need to know about the DASH eating plan? For the DASH eating plan, you will follow these general guidelines:  Choose foods with less than 150 milligrams of sodium per serving (as listed on the food label).  Use salt-free seasonings or herbs instead of table salt or sea salt.  Check with your health care provider or pharmacist before using salt substitutes.  Eat lower-sodium products. These are often labeled as "low-sodium" or "no salt added."  Eat fresh foods. Avoid eating a lot of canned foods.  Eat more vegetables, fruits, and low-fat dairy products.  Choose whole grains. Look for the word "whole" as the first word in the ingredient list.  Choose fish and skinless chicken or [place] more often than red meat. Limit fish, poultry, and meat to 6 oz (170 g) each day.  Limit sweets, desserts, sugars, and sugary drinks.  Choose heart-healthy fats.  Eat more home-cooked food and less restaurant, buffet, and fast food.  Limit fried  foods.  Do not fry foods. Cook foods using methods such as baking, boiling, grilling, and broiling instead.  When eating at a restaurant, ask that your food be prepared with less salt, or no salt if possible. What foods can I eat? Seek help from a dietitian for individual calorie needs. Grains  Whole grain or whole wheat bread. Brown rice. Whole grain or whole wheat pasta. Quinoa, bulgur, and whole grain cereals. Low-sodium cereals. Corn or whole wheat flour tortillas. Whole grain cornbread. Whole grain crackers. Low-sodium crackers. Vegetables  Fresh or frozen vegetables (raw, steamed, roasted, or grilled). Low-sodium or reduced-sodium tomato and vegetable juices. Low-sodium or reduced-sodium tomato sauce and paste. Low-sodium or reduced-sodium canned vegetables. Fruits  All fresh, canned (in natural juice), or frozen fruits. Meat and Other Protein Products  Ground beef (85% or leaner), grass-fed beef, or beef trimmed of fat. Skinless chicken or [place]. Ground chicken or [place]. Pork trimmed of fat. All fish and seafood. Eggs. Dried beans, peas, or lentils. Unsalted nuts and seeds. Unsalted canned beans. Dairy  Low-fat dairy products, such as skim or 1% milk, 2% or reduced-fat cheeses, low-fat ricotta or cottage cheese, or plain low-fat yogurt. Low-sodium or reduced-sodium cheeses. Fats and Oils  Tub margarines without trans fats. Light or reduced-fat mayonnaise and salad dressings (reduced sodium). Avocado. Safflower, olive, or canola oils. Natural peanut or almond butter. Other  Unsalted popcorn and pretzels. The items listed above may not be a complete list of recommended foods or beverages.  Contact your dietitian for more options.  What foods are not recommended? Grains  White bread. White pasta. White rice. Refined cornbread. Bagels and croissants. Crackers that contain trans fat. Vegetables  Creamed or fried vegetables. Vegetables in a cheese sauce. Regular canned vegetables. Regular  canned tomato sauce and paste. Regular tomato and vegetable juices. Fruits  Canned fruit in light or heavy syrup. Fruit juice. Meat and Other Protein Products  Fatty cuts of meat. Ribs, chicken wings, bacon, sausage, bologna, salami, chitterlings, fatback, hot dogs, bratwurst, and packaged luncheon meats. Salted nuts and seeds. Canned beans with salt. Dairy  Whole or 2% milk, cream, half-and-half, and cream cheese. Whole-fat or sweetened yogurt. Full-fat cheeses or blue cheese. Nondairy creamers and whipped toppings. Processed cheese, cheese spreads, or cheese curds. Condiments  Onion and garlic salt, seasoned salt, table salt, and sea salt. Canned and packaged gravies. Worcestershire sauce. Tartar sauce. Barbecue sauce. Teriyaki sauce. Soy sauce, including reduced sodium. Steak sauce. Fish sauce. Oyster sauce. Cocktail sauce. Horseradish. Ketchup and mustard. Meat flavorings and tenderizers. Bouillon cubes. Hot sauce. Tabasco sauce. Marinades. Taco seasonings. Relishes. Fats and Oils  Butter, stick margarine, lard, shortening, ghee, and bacon fat. Coconut, palm kernel, or palm oils. Regular salad dressings. Other  Pickles and olives. Salted popcorn and pretzels. The items listed above may not be a complete list of foods and beverages to avoid. Contact your dietitian for more information.  Where can I find more information? National Heart, Lung, and Blood Institute: travelstabloid.com This information is not intended to replace advice given to you by your health care provider. Make sure you discuss any questions you have with your health care provider. Document Released: 06/01/2011 Document Revised: 11/18/2015 Document Reviewed: 04/16/2013 Elsevier Interactive Patient Education  2017 [employer].

## 2016-07-07 ENCOUNTER — Telehealth: Payer: Self-pay

## 2016-07-07 NOTE — Telephone Encounter (Signed)
Records rcvd on pt from [facility]. Placed in your folder for review. Tracy Holt

## 2016-07-24 ENCOUNTER — Encounter: Payer: Self-pay | Admitting: Family Medicine

## 2016-08-02 ENCOUNTER — Ambulatory Visit: Payer: BLUE CROSS/BLUE SHIELD | Admitting: Family Medicine

## 2016-08-10 ENCOUNTER — Ambulatory Visit: Payer: Self-pay | Admitting: Family Medicine

## 2016-08-14 ENCOUNTER — Ambulatory Visit: Payer: BLUE CROSS/BLUE SHIELD | Admitting: Family Medicine

## 2016-08-18 ENCOUNTER — Encounter: Payer: Self-pay | Admitting: Family Medicine

## 2016-08-21 ENCOUNTER — Ambulatory Visit (INDEPENDENT_AMBULATORY_CARE_PROVIDER_SITE_OTHER): Payer: BLUE CROSS/BLUE SHIELD | Admitting: Family Medicine

## 2016-08-21 ENCOUNTER — Encounter: Payer: Self-pay | Admitting: Family Medicine

## 2016-08-21 VITALS — BP 130/88 | HR 61 | Wt 246.0 lb

## 2016-08-21 DIAGNOSIS — I10 Essential (primary) hypertension: Secondary | ICD-10-CM | POA: Diagnosis not present

## 2016-08-21 DIAGNOSIS — M25562 Pain in left knee: Secondary | ICD-10-CM

## 2016-08-21 DIAGNOSIS — G8929 Other chronic pain: Secondary | ICD-10-CM | POA: Diagnosis not present

## 2016-08-21 LAB — CBC WITH DIFFERENTIAL/PLATELET
BASOS PCT: 0 %
Basophils Absolute: 0 cells/uL (ref 0–200)
EOS PCT: 0 %
Eosinophils Absolute: 0 cells/uL — ABNORMAL LOW (ref 15–500)
HEMATOCRIT: 39.9 % (ref 35.0–45.0)
Hemoglobin: 13.3 g/dL (ref 11.7–15.5)
Lymphocytes Relative: 29 %
Lymphs Abs: 2262 cells/uL (ref 850–3900)
MCH: 29.2 pg (ref 27.0–33.0)
MCHC: 33.3 g/dL (ref 32.0–36.0)
MCV: 87.7 fL (ref 80.0–100.0)
MONO ABS: 546 {cells}/uL (ref 200–950)
MPV: 10.3 fL (ref 7.5–12.5)
Monocytes Relative: 7 %
Neutro Abs: 4992 cells/uL (ref 1500–7800)
Neutrophils Relative %: 64 %
PLATELETS: 282 10*3/uL (ref 140–400)
RBC: 4.55 MIL/uL (ref 3.80–5.10)
RDW: 13.8 % (ref 11.0–15.0)
WBC: 7.8 10*3/uL (ref 4.0–10.5)

## 2016-08-21 LAB — COMPLETE METABOLIC PANEL WITH GFR
ALT: 18 U/L (ref 6–29)
AST: 24 U/L (ref 10–30)
Albumin: 4.1 g/dL (ref 3.6–5.1)
Alkaline Phosphatase: 57 U/L (ref 33–115)
BUN: 16 mg/dL (ref 7–25)
CALCIUM: 9.4 mg/dL (ref 8.6–10.2)
CHLORIDE: 104 mmol/L (ref 98–110)
CO2: 27 mmol/L (ref 20–31)
CREATININE: 0.81 mg/dL (ref 0.50–1.10)
GFR, Est African American: >89 mL/min (ref >=60)
GFR, Est Non African American: >89 mL/min (ref >=60)
GLUCOSE: 92 mg/dL (ref 65–99)
POTASSIUM: 4.1 mmol/L (ref 3.5–5.3)
SODIUM: 140 mmol/L (ref 135–146)
Total Bilirubin: 0.5 mg/dL (ref 0.2–1.2)
Total Protein: 7.3 g/dL (ref 6.1–8.1)

## 2016-08-21 NOTE — Progress Notes (Addendum)
Subjective:    Patient ID: Tracy Holt, female    DOB: 10-Jul-1975, 41 y.o.   MRN: [id number]  HPI Chief Complaint  Patient presents with  . follow-up    follow-up   She is here for follow up on HTN. She is checking her blood pressures. Her readings have been 141/94, 132/90, 133/103, 137/86, 117/79, 131/113.  She has been on this medication for at least one month. She is not having any issues but concerned that her BP is running high in spite good medication compliance. She admits to not eating healthy foods. She eats out a lot. Drives back and forth every week from her home in [place] to [place] for work. She does not exercise. She is aware that she is obese by her BMI.   States she recalls having high blood pressure since 2nd grade. States as a child she was on a restricted sodium diet. Does not recall having any further work up to look for an explanation for high BP. Family history of HTN as well.  Beta blocker could not tolerate.   Denies fever, chills, headaches, dizziness, chest pain, palpitations, cough, shortness of breath, GI or GU symptoms.   She also complains of left knee pain that is chronic. Pain is located anteriorly mostly but she cannot discern exactly where it is hurting today. States it hurts "all over". Pain is a dull ache almost constantly and worse when going down steps and after being on her feet for 30 minutes or longer. Denies locking, popping or giving away. Denies numbness, tingling or weakness.  Has been evaluated by orthopedist in [place] and XR showed bone spur. They recommended wearing a knee brace, quad strengthening exercises and possible PT. She does not want to go back to an orthopedist at this time. States NSAIDs are not helping but she has not taken any in the past few weeks.  States knee pain is keeping her from exercising.  Denies history of injury or surgery to her left knee.   Reviewed allergies, medications, past medical, surgical, family, and  social history.  Review of Systems Pertinent positives and negatives in the history of present illness.     Objective:   Physical Exam  Constitutional: She is oriented to person, place, and time. She appears well-developed and well-nourished. No distress.  Eyes: Conjunctivae and EOM are normal. Pupils are equal, round, and reactive to light.  Neck: Normal range of motion. Neck supple. No JVD present. No thyromegaly present.  Cardiovascular: Normal rate, regular rhythm and normal heart sounds.  Exam reveals no gallop and no friction rub.   No murmur heard. Pulmonary/Chest: Effort normal and breath sounds normal.  Musculoskeletal:       Left knee: She exhibits normal range of motion, no swelling, no LCL laxity, no bony tenderness, normal meniscus and no MCL laxity. No tenderness found.  No erythema, effusion or tenderness. No laxity. Negative McMurrays.   Lymphadenopathy:    She has no cervical adenopathy.  Neurological: She is alert and oriented to person, place, and time. She has normal reflexes.  Skin: Skin is warm and dry. No erythema. No pallor.  Psychiatric: She has a normal mood and affect. Her behavior is normal. Judgment and thought content normal.   BP 130/88   Pulse 61   Wt 246 lb (111.6 kg)   BMI 38.82 kg/m       Assessment & Plan:  Essential hypertension - Plan: CBC with Differential/Platelet, COMPLETE METABOLIC PANEL WITH GFR  Chronic pain of left knee  Discussed that her blood pressure is not within goal range and has not improved with current medication. Discussed that I will check labs and look for other possible underlying etiologies for elevated BP. She is aware that diet, obesity and lack of exercise all play a role in uncontrolled HTN. Suspect we will need to add additional medication once I get her labs back. She may need a renal US or possible referral to nephrologist to help [place] with BP.  Discussed options for knee pain management including steroid injection,  PT, or referral to a local orthopedist. She declines referral or PT at this time and would like to try an injection. Dr. Redmond School assisted me with this.  Her knee was sterilized with alcohol and then betadine. 3 mL of Lidocaine and 40 mg of Kenalog was injected into the inferior lateral aspect of her knee without difficulty. Minimal blood loss occurred from withdrawal of the needle. A band aid was placed. She tolerated this well. Discussed that I would like for her to report back how she is doing in a few days and that we can also refer her to PT if she is ready.

## 2016-08-22 DIAGNOSIS — M25562 Pain in left knee: Secondary | ICD-10-CM | POA: Diagnosis not present

## 2016-08-22 DIAGNOSIS — G8929 Other chronic pain: Secondary | ICD-10-CM | POA: Diagnosis not present

## 2016-08-22 MED ORDER — LIDOCAINE HCL 2 % IJ SOLN
3.0000 mL | Freq: Once | INTRAMUSCULAR | Status: AC
  Administered 2016-08-22: 60 mg

## 2016-08-22 MED ORDER — TRIAMCINOLONE ACETONIDE 40 MG/ML IJ SUSP
40.0000 mg | Freq: Once | INTRAMUSCULAR | Status: AC
  Administered 2016-08-22: 40 mg via INTRAMUSCULAR

## 2016-08-22 NOTE — Addendum Note (Signed)
Addended by: Minette Headland A on: 08/22/2016 10:31 AM   Modules accepted: Orders

## 2016-08-23 ENCOUNTER — Encounter: Payer: Self-pay | Admitting: Family Medicine

## 2016-08-24 ENCOUNTER — Encounter: Payer: Self-pay | Admitting: Family Medicine

## 2016-09-06 ENCOUNTER — Encounter: Payer: Self-pay | Admitting: Family Medicine

## 2016-09-11 ENCOUNTER — Ambulatory Visit
Admission: RE | Admit: 2016-09-11 | Discharge: 2016-09-11 | Disposition: A | Discharge status: Home / Self Care | Payer: BLUE CROSS/BLUE SHIELD | Source: Ambulatory Visit | Attending: Family Medicine | Admitting: Family Medicine

## 2016-09-11 ENCOUNTER — Ambulatory Visit (INDEPENDENT_AMBULATORY_CARE_PROVIDER_SITE_OTHER): Payer: BLUE CROSS/BLUE SHIELD | Admitting: Family Medicine

## 2016-09-11 ENCOUNTER — Encounter: Payer: Self-pay | Admitting: Family Medicine

## 2016-09-11 VITALS — BP 124/80 | HR 83 | Wt 245.0 lb

## 2016-09-11 DIAGNOSIS — R002 Palpitations: Secondary | ICD-10-CM | POA: Diagnosis not present

## 2016-09-11 DIAGNOSIS — R000 Tachycardia, unspecified: Secondary | ICD-10-CM | POA: Diagnosis not present

## 2016-09-11 DIAGNOSIS — I10 Essential (primary) hypertension: Secondary | ICD-10-CM | POA: Diagnosis not present

## 2016-09-11 DIAGNOSIS — M25562 Pain in left knee: Secondary | ICD-10-CM | POA: Diagnosis not present

## 2016-09-11 DIAGNOSIS — G8929 Other chronic pain: Secondary | ICD-10-CM | POA: Diagnosis not present

## 2016-09-11 DIAGNOSIS — Z136 Encounter for screening for cardiovascular disorders: Secondary | ICD-10-CM

## 2016-09-11 NOTE — Progress Notes (Signed)
Subjective:    Patient ID: Tracy Holt, female    DOB: 05/21/1976, 41 y.o.   MRN: [id number]  HPI Chief Complaint  Patient presents with  . mulitple issues    multiple issues- blood pressure and heart rate (sometimes its normal and sometimes its high) and knee pain. only lasted 3 days with steriod   She is here to discuss having a 4-6 week history of sporadic high blood pressure and elevated resting heart rate. States she occasionally has a resting HR in the 120s, 130s and last week on one occasion her resting HR was 154. States her HR feels regular when it is beating fast. States this occurs at random times of the day and not when she is stressed or anxious. States it lasts for a few seconds to a couple of minutes. Occurs 2-3 times per day and 3-5 days per week. Nothing seems to trigger it and nothing alleviates it.  Denies having associated dizziness, chest pain, shortness of breath, nausea, vomiting.   States BP at home has been in the 130s/80 consistently.   Also complaints of a whooshing in left ear intermittent for the past 6-8 months. 2 times per day that lasts a few seconds to a couple of minutes. She can stop this by putting her finger in her ear. States this occurs at random times during the day.   Denies a personal history of heart disease. She is aware that she is obese which increases her risk for heart disease.   Family history of heart disease- PGM died in her mid 51s and had CHF. Maternal family history unknown.  Mother and father with DM, hyperlipidemia, HTN.   She would also like to discuss left knee pain, chronic. Injection was given on 08/21/2016 and states it only seemed to help her pain for approximately 3 days. She has seen an orthopedist in the past for knee pain and does not want to go to the orthopedist in [place]. Prefers a referral to an orthopedist in [place]. Is not interested in PT at this time. States she has been referred to PT in the past. No locking,  popping or giving away. No new injury or symptoms.   Lipids were checked in April 2017 per chart at her previous PCP.   Reviewed allergies, medications, past medical, surgical, family, and social history.    Review of Systems Pertinent positives and negatives in the history of present illness.     Objective:   Physical Exam  Constitutional: She is oriented to person, place, and time. She appears well-developed and well-nourished. No distress.  HENT:  Mouth/Throat: Oropharynx is clear and moist.  Eyes: Conjunctivae are normal. Pupils are equal, round, and reactive to light.  Neck: Normal range of motion. Neck supple. No JVD present. Carotid bruit is not present.  Cardiovascular: Normal rate, regular rhythm, normal heart sounds and intact distal pulses.  PMI is not displaced.  Exam reveals no gallop and no friction rub.   No murmur heard. No LE edema  Pulmonary/Chest: Effort normal and breath sounds normal.  Neurological: She is alert and oriented to person, place, and time.  Skin: Skin is warm and dry. No pallor.  Psychiatric: She has a normal mood and affect. Her behavior is normal. Judgment and thought content normal.   BP 124/80   Pulse 83   Wt 245 lb (111.1 kg)   BMI 38.66 kg/m       Assessment & Plan:  Intermittent palpitations - Plan: DG Chest  2 View, TSH, Holter monitor - 48 hour, Ambulatory referral to Cardiology, CANCELED: TSH, CANCELED: Holter monitor - 24 hour, CANCELED: Holter monitor - 72 hour  Essential hypertension  Chronic pain of left knee - Plan: Ambulatory referral to Orthopedic Surgery  Screening for heart disease - Plan: Lipid panel, CANCELED: Lipid panel  Rapid resting heart rate - Plan: Ambulatory referral to Cardiology  She is asymptomatic today and her exam is unremarkable.  Return in the morning for fasting lipids and TSH. Will get a repeat EKG at that time as well.  Holter monitor - 48 hours for palpitations.  Chest XR ordered.  Plan to  initiate referral to cardiologist. She may need an echo but I will hold off for now and see what her holter monitor findings are.  BP is within goal range. Continue current medication. She was unable to tolerate beta blocker in the past, complaint was anxiety with medication.  Referral to [facility] for chronic knee pain since she has not gotten relief with conservative care or with injection. she declines PT at this time.  She does not want to return to the orthopedist in [place] so I will refer her to a local orthopedist.

## 2016-09-11 NOTE — Patient Instructions (Signed)
Return in the morning for fasting lipids.   You will receive a phone call to go pick up heart monitor.   [facility] will call you to schedule an appointment.   Your blood pressure is in goal range today.

## 2016-09-13 ENCOUNTER — Ambulatory Visit: Payer: BLUE CROSS/BLUE SHIELD | Admitting: Family Medicine

## 2016-09-13 ENCOUNTER — Other Ambulatory Visit: Payer: BLUE CROSS/BLUE SHIELD

## 2016-09-13 ENCOUNTER — Encounter: Payer: Self-pay | Admitting: Cardiovascular Disease

## 2016-09-13 ENCOUNTER — Other Ambulatory Visit (INDEPENDENT_AMBULATORY_CARE_PROVIDER_SITE_OTHER): Payer: Self-pay

## 2016-09-13 ENCOUNTER — Ambulatory Visit (INDEPENDENT_AMBULATORY_CARE_PROVIDER_SITE_OTHER): Payer: BLUE CROSS/BLUE SHIELD

## 2016-09-13 ENCOUNTER — Ambulatory Visit (INDEPENDENT_AMBULATORY_CARE_PROVIDER_SITE_OTHER): Payer: BLUE CROSS/BLUE SHIELD | Admitting: Cardiovascular Disease

## 2016-09-13 ENCOUNTER — Ambulatory Visit (INDEPENDENT_AMBULATORY_CARE_PROVIDER_SITE_OTHER): Payer: BLUE CROSS/BLUE SHIELD | Admitting: Orthopaedic Surgery

## 2016-09-13 VITALS — BP 110/80 | HR 78 | Ht 66.75 in | Wt 244.8 lb

## 2016-09-13 DIAGNOSIS — G8929 Other chronic pain: Secondary | ICD-10-CM

## 2016-09-13 DIAGNOSIS — R002 Palpitations: Secondary | ICD-10-CM

## 2016-09-13 DIAGNOSIS — M25562 Pain in left knee: Secondary | ICD-10-CM

## 2016-09-13 DIAGNOSIS — I10 Essential (primary) hypertension: Secondary | ICD-10-CM

## 2016-09-13 DIAGNOSIS — Z136 Encounter for screening for cardiovascular disorders: Secondary | ICD-10-CM

## 2016-09-13 LAB — LIPID PANEL
CHOLESTEROL: 185 mg/dL (ref <200)
HDL: 60 mg/dL (ref >50)
LDL CALC: 106 mg/dL — AB (ref <100)
Total CHOL/HDL Ratio: 3.1 Ratio (ref <5.0)
Triglycerides: 94 mg/dL (ref <150)
VLDL: 19 mg/dL (ref <30)

## 2016-09-13 LAB — TSH: TSH: 2.03 m[IU]/L

## 2016-09-13 NOTE — Progress Notes (Signed)
Cardiology Office Note   Date:  09/13/2016   ID:  Tracy Holt, DOB [birth year], MRN [id number]  PCP:  Harland Dingwall, NP-C  Cardiologist:   Mertie Moores, MD   Chief Complaint  Patient presents with  . Palpitations   Problem List 1. Palpitations. 2. Essential HTN   History of Present Illness: Tracy Holt is a 41 y.o. female who presents for palpitations / fast HR  And a whooshing sound in leaft ear  Several Months of palpitations. They're not associated with any specific activity. Specifically there not associated with eating, drinking, change of position, or exercise.  These might last for a couple minutes. She thinks that it's a fairly regular tachycardia. Episodes spontaneously resolve after a couple minutes She describes a heaviness in her chest during the palpitations. She denies any dizziness or shortness breath.  Happens several times a day  Not assocated with any pattern   Happens at all times through the day.  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    Past Medical History:  Diagnosis Date  . Colonic polyp   . Costochondritis   . Depression   . Elevated blood pressure (not hypertension)   . History of lump in breast   . Hypertension   . UC (ulcerative colitis) [facility])     Past Surgical History:  Procedure Laterality Date  . CESAREAN SECTION    . COLONOSCOPY W/ POLYPECTOMY    . TUBAL LIGATION       Current Outpatient Prescriptions  Medication Sig Dispense Refill  . lisinopril-hydrochlorothiazide (PRINZIDE,ZESTORETIC) 20-12.5 MG tablet Take 1 tablet by mouth daily. 30 tablet 2   No current facility-administered medications for this visit.     PAD Screen 09/13/2016  Previous PAD dx? No  Previous surgical procedure? No  Pain with walking? Yes  Subsides with rest? No  Feet/toe relief with dangling? No  Painful, non-healing ulcers? No  Extremities discolored? No      Allergies:   Patient has no known allergies.    Social  History:  The patient  reports that she quit smoking about 3 years ago. She has never used smokeless tobacco. She reports that she drinks alcohol. She reports that she does not use drugs.   Family History:  The patient's family history includes Cancer in her paternal grandmother; Diabetes in her father and mother; Heart disease in her paternal grandfather; Heart failure in her paternal grandmother; Hyperlipidemia in her father; Hypertension in her father and mother; Thyroid disease in her mother.    ROS:  Please see the history of present illness.    Review of Systems: Constitutional:  denies fever, chills, diaphoresis, appetite change and fatigue.  HEENT: denies photophobia, eye pain, redness, hearing loss, ear pain, congestion, sore throat, rhinorrhea, sneezing, neck pain, neck stiffness and tinnitus.  Respiratory: denies SOB, DOE, cough, chest tightness, and wheezing.  Cardiovascular: denies chest pain, palpitations and leg swelling.  Gastrointestinal: denies nausea, vomiting, abdominal pain, diarrhea, constipation, blood in stool.  Genitourinary: denies dysuria, urgency, frequency, hematuria, flank pain and difficulty urinating.  Musculoskeletal: denies  myalgias, back pain, joint swelling, arthralgias and gait problem.   Skin: denies pallor, rash and wound.  Neurological: denies dizziness, seizures, syncope, weakness, light-headedness, numbness and headaches.   Hematological: denies adenopathy, easy bruising, personal or family bleeding history.  Psychiatric/ Behavioral: denies suicidal ideation, mood changes, confusion, nervousness, sleep disturbance and agitation.       All other systems are reviewed and negative.    PHYSICAL  EXAM: VS:  BP 110/80 (BP Location: Left Arm, Patient Position: Sitting, Cuff Size: Large)   Pulse 78   Ht 5' 6.75" (1.695 m)   Wt 244 lb 12.8 oz (111 kg)   SpO2 96%   BMI 38.63 kg/m  , BMI Body mass index is 38.63 kg/m. GEN: Well nourished, well  developed, in no acute distress  HEENT: normal  Neck: no JVD, carotid bruits, or masses Cardiac: RRR; no murmurs, rubs, or gallops,no edema  Respiratory:  clear to auscultation bilaterally, normal work of breathing GI: soft, nontender, nondistended, + BS MS: no deformity or atrophy  Skin: warm and dry, no rash Neuro:  Strength and sensation are intact Psych: normal   EKG:  EKG is ordered today. The ekg ordered today demonstrates  NSR at 78.   Normal ECG     Recent Labs: 12/29/2015: TSH 2.72 08/21/2016: ALT 18; BUN 16; Creat 0.81; Hemoglobin 13.3; Platelets 282; Potassium 4.1; Sodium 140    Lipid Panel No results found for: CHOL, TRIG, HDL, CHOLHDL, VLDL, LDLCALC, LDLDIRECT    Wt Readings from Last 3 Encounters:  09/13/16 244 lb 12.8 oz (111 kg)  09/11/16 245 lb (111.1 kg)  08/21/16 246 lb (111.6 kg)      Other studies Reviewed: Additional studies/ records that were reviewed today include: . Review of the above records demonstrates:    ASSESSMENT AND PLAN:  1.  Palpitations: Her palpitations last for several minutes. She may have supraventricular tachycardia. We discussed the Valsalva maneuver and also the diving reflex. Her symptoms only last for about a minute or 2 so I do not think Propranolol  would be of any used.  She is scheduled to wear a 48-hour Holter monitor but I think that a 30 day event monitor would be more useful. We will cancel the order for the Holter monitor. We will reschedule the 30 day event monitor.  I'll see her back in 6 weeks for follow-up visit.   2. Essential HTN Her blood pressures fairly normal.   Current medicines are reviewed at length with the patient today.  The patient does not have concerns regarding medicines.  Labs/ tests ordered today include:   Orders Placed This Encounter  Procedures  . Cardiac event monitor  . EKG 12-Lead     Disposition:   FU with me in 6 weeks      Mertie Moores, MD  09/13/2016 5:28 PM    [address], [place], [place]  [address] Phone: [phone]; Fax: [phone]

## 2016-09-13 NOTE — Progress Notes (Signed)
Office Visit Note   Patient: Tracy Holt           Date of Birth: 1976/01/15           MRN: [id number] Visit Date: 09/13/2016              Requested by: Girtha Rm, NP-C [place],  [id number] PCP: Harland Dingwall, NP-C   Assessment & Plan: Visit Diagnoses:  1. Chronic pain of left knee     Plan: At this point she is tried and failed all forms of conservative treatment. An MRI is warranted to rule out synovitis of the knee as well as a meniscal tear as well as assess the medial compartment in terms of any arthritic changes given the amount of pain she is having and the joint space narrowing on her x-rays. She will try this supplement Tumeric as well since his has a low GI side effect profile. We'll see her back after the MRI of her left knee. She may be a candidate for hyaluronic acid injections as well.  Follow-Up Instructions: Return in about 3 weeks (around 10/04/2016).   Orders:  Orders Placed This Encounter  Procedures  . XR Knee 1-2 Views Left   No orders of the defined types were placed in this encounter.     Procedures: No procedures performed   Clinical Data: No additional findings.   Subjective: No chief complaint on file. The patient is a very pleasant 41 year old female with a history of chronic bilateral knee pain really left worse than the right. This is been going on since she was 41 years old. She is now 66. The knee swells on occasion. It occasionally locks and catches on her. She has worn a knee immobilizer over this last year and a knee brace to help with her symptoms. She's had an intra-articular steroid injection as well. She's work on Forensic scientist exercises as well as activity modification. She does have a history of ulcerative colitis. She cannot take anti-inflammatories due to this. It's getting to where it is detrimentally affected her activity is daily living, her quality of life, and her mobility. The pain can be 10  out of 10 at times.  HPI  Review of Systems She denies any chest pain, shortness of breath, fever, chills, vomiting, nausea  Objective: Vital Signs: There were no vitals taken for this visit.  Physical Exam She is alert and oriented 3 in no acute distress Ortho Exam Examination of her left knee shows full range of motion with some slight hyperextension. She has significant pain over the pes bursa and in global tenderness with the knee in general. She hurts medially and laterally as well as the patellofemoral joint. Her patella tracks slightly laterally. Specialty Comments:  No specialty comments available.  Imaging: Xr Knee 1-2 Views Left  Result Date: 09/13/2016 An AP and lateral of her left knee shows a slight varus malalignment with slight medial joint space narrowing.    PMFS History: Patient Active Problem List   Diagnosis Date Noted  . Morbid obesity ([place]) 12/29/2015  . Essential hypertension 12/29/2015  . Routine general medical examination at a health care facility 12/29/2015   Past Medical History:  Diagnosis Date  . Colonic polyp   . Costochondritis   . Depression   . Elevated blood pressure (not hypertension)   . History of lump in breast   . Hypertension   . UC (ulcerative colitis) ([place])     Family History  Problem Relation Age of Onset  . Thyroid disease Mother   . Diabetes Mother   . Hypertension Mother   . Diabetes Father   . Hyperlipidemia Father   . Hypertension Father   . Cancer Paternal Grandmother   . Heart disease Paternal Grandfather     Past Surgical History:  Procedure Laterality Date  . CESAREAN SECTION    . COLONOSCOPY W/ POLYPECTOMY    . TUBAL LIGATION     Social History   Occupational History  . Not on file.   Social History Main Topics  . Smoking status: Former Smoker    Quit date: 05/30/2013  . Smokeless tobacco: Never Used  . Alcohol use Yes     Comment: rarely.   . Drug use: No  . Sexual activity: Yes    Birth  control/ protection: None     Comment: 2 kids. 1-2 cigs per day for intermittent for 10 years

## 2016-09-13 NOTE — Patient Instructions (Addendum)
Medication Instructions:  Your physician recommends that you continue on your current medications as directed. Please refer to the Current Medication list given to you today.   Labwork: None Ordered   Testing/Procedures: Your physician has recommended that you wear an event monitor. Event monitors are medical devices that record the heart's electrical activity. Doctors most often [place] these monitors to diagnose arrhythmias. Arrhythmias are problems with the speed or rhythm of the heartbeat. The monitor is a small, portable device. You can wear one while you do your normal daily activities. This is usually used to diagnose what is causing palpitations/syncope (passing out).  Appointment for 48 hour holter monitor needs to be cancelled and she needs appointment for event monitor   Follow-Up: Your physician recommends that you schedule a follow-up appointment in: 6 weeks with Dr. Acie Fredrickson   If you need a refill on your cardiac medications before your next appointment, please call your pharmacy.   Thank you for choosing [facility]! Christen Bame, RN [phone]

## 2016-09-14 ENCOUNTER — Ambulatory Visit (INDEPENDENT_AMBULATORY_CARE_PROVIDER_SITE_OTHER): Payer: BLUE CROSS/BLUE SHIELD

## 2016-09-14 DIAGNOSIS — R002 Palpitations: Secondary | ICD-10-CM | POA: Diagnosis not present

## 2016-09-16 DIAGNOSIS — R002 Palpitations: Secondary | ICD-10-CM | POA: Diagnosis not present

## 2016-09-26 ENCOUNTER — Ambulatory Visit
Admission: RE | Admit: 2016-09-26 | Discharge: 2016-09-26 | Disposition: A | Discharge status: Home / Self Care | Payer: BLUE CROSS/BLUE SHIELD | Source: Ambulatory Visit | Attending: Orthopaedic Surgery | Admitting: Orthopaedic Surgery

## 2016-09-26 DIAGNOSIS — M25562 Pain in left knee: Secondary | ICD-10-CM | POA: Diagnosis not present

## 2016-09-26 DIAGNOSIS — G8929 Other chronic pain: Secondary | ICD-10-CM

## 2016-10-04 ENCOUNTER — Ambulatory Visit (INDEPENDENT_AMBULATORY_CARE_PROVIDER_SITE_OTHER): Payer: BLUE CROSS/BLUE SHIELD | Admitting: Physician Assistant

## 2016-10-04 ENCOUNTER — Other Ambulatory Visit: Payer: Self-pay | Admitting: Family Medicine

## 2016-10-04 DIAGNOSIS — M23204 Derangement of unspecified medial meniscus due to old tear or injury, left knee: Secondary | ICD-10-CM

## 2016-10-04 DIAGNOSIS — I10 Essential (primary) hypertension: Secondary | ICD-10-CM

## 2016-10-04 NOTE — Progress Notes (Signed)
Office Visit Note   Patient: Tracy Holt           Date of Birth: [birth year]           MRN: [id number] Visit Date: 10/04/2016              Requested by: Tracy Rm, NP-C [place], [place] [address] PCP: Tracy Dingwall, NP-C   Assessment & Plan: Visit Diagnoses:  1. Other old tear of medial meniscus of left knee     Plan: Left knee arthroscopy with medial menisectomy in the near future . Follow-up after the knee arthroscopy. Discussed risks including nerve or vessel injury PE DVT wound healing problems prolonged pain and worsening pain. She wants to proceed with this in the near future. Discussed with her postoperative she may need to undergo a cortisone injection supplemental injections. 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  Follow-Up Instructions: Return in about 1 week (around 10/11/2016) for post op.   Orders:  No orders of the defined types were placed in this encounter.  No orders of the defined types were placed in this encounter.     Procedures: No procedures performed   Clinical Data: No additional findings.   Subjective: Left knee pain  HPI [name] returns today follow-up of her left knee pain. She continues to have pain in the knee that can be 10 out of 10 pain at times. She cannot trust the knee especially going up or down steps. Pain been worse over the last 8 months. She is here to review the MRI findings of her left knee MRI left knee dated 09/26/2016 results reviewed with patient using a knee model for visualization.. Large radial tear the root of the posterior horn with adjacent grade 1 and grade 2 posterior horn and mid body signal. Patellofemoral mild to moderate chondromalacia. Mild degenerative changes medial compartment.  Review of Systems   Objective: Vital Signs: There were no vitals taken for this visit.  Physical Exam Alert and oriented 3 Ortho Exam Ambulates without any assistive  device. Specialty Comments:  No specialty comments available.  Imaging: No results found.   PMFS History: Patient Active Problem List   Diagnosis Date Noted  . Heart palpitations 09/13/2016  . Morbid obesity ([place]) 12/29/2015  . Essential hypertension 12/29/2015  . Routine general medical examination at a health care facility 12/29/2015   Past Medical History:  Diagnosis Date  . Colonic polyp   . Costochondritis   . Depression   . Elevated blood pressure (not hypertension)   . History of lump in breast   . Hypertension   . UC (ulcerative colitis) ([place])     Family History  Problem Relation Age of Onset  . Thyroid disease Mother   . Diabetes Mother   . Hypertension Mother   . Diabetes Father   . Hyperlipidemia Father   . Hypertension Father   . Cancer Paternal Grandmother   . Heart failure Paternal Grandmother   . Heart disease Paternal Grandfather     Past Surgical History:  Procedure Laterality Date  . CESAREAN SECTION    . COLONOSCOPY W/ POLYPECTOMY    . TUBAL LIGATION     Social History   Occupational History  . Not on file.   Social History Main Topics  . Smoking status: Former Smoker    Quit date: 05/30/2013  . Smokeless tobacco: Never Used  . Alcohol use Yes     Comment: rarely.   Marland Kitchen  Drug use: No  . Sexual activity: Yes    Birth control/ protection: None     Comment: 2 kids. 1-2 cigs per day for intermittent for 10 years

## 2016-10-19 ENCOUNTER — Telehealth: Payer: Self-pay | Admitting: Nurse Practitioner

## 2016-10-19 MED ORDER — CARVEDILOL 6.25 MG PO TABS: 6.2500 mg | ORAL_TABLET | Freq: Two times a day (BID) | ORAL | 11 refills | Status: DC

## 2016-10-19 NOTE — Telephone Encounter (Signed)
Reviewed results and plan of care with patient who verbalized understanding and agreement to d/c lisinopril/hctz and start carvedilol 6.25 mg twice daily. She is aware to call back prior to next appointment on 5/7 if she has questions or concerns. She thanked me for the call.

## 2016-10-19 NOTE — Telephone Encounter (Signed)
-----   Message from Thayer Headings, MD sent at 10/19/2016  8:08 AM EDT ----- She has sinus tach on occasion. I think she would do better with a beta blocker instead of the Lisinopril / HCTZ. DC lisinopril / HCTZ and start Coreg 6.25 BID.   Will see her at her appt in 2 weeks and reassess .

## 2016-10-24 HISTORY — PX: OTHER SURGICAL HISTORY: SHX169

## 2016-10-30 ENCOUNTER — Ambulatory Visit (INDEPENDENT_AMBULATORY_CARE_PROVIDER_SITE_OTHER): Payer: BLUE CROSS/BLUE SHIELD | Admitting: Cardiovascular Disease

## 2016-10-30 ENCOUNTER — Encounter: Payer: Self-pay | Admitting: Cardiovascular Disease

## 2016-10-30 VITALS — BP 140/86 | HR 72 | Ht 66.75 in | Wt 250.4 lb

## 2016-10-30 DIAGNOSIS — R0989 Other specified symptoms and signs involving the circulatory and respiratory systems: Secondary | ICD-10-CM | POA: Diagnosis not present

## 2016-10-30 DIAGNOSIS — R002 Palpitations: Secondary | ICD-10-CM

## 2016-10-30 DIAGNOSIS — I10 Essential (primary) hypertension: Secondary | ICD-10-CM

## 2016-10-30 MED ORDER — CARVEDILOL 12.5 MG PO TABS: 12.5000 mg | ORAL_TABLET | Freq: Two times a day (BID) | ORAL | 11 refills | Status: DC

## 2016-10-30 NOTE — Progress Notes (Signed)
Cardiology Office Note   Date:  10/30/2016   ID:  Tracy Holt, DOB 08/19/1975, MRN [id number]  PCP:  Girtha Rm, NP-C  Cardiologist:   Mertie Moores, MD   Chief Complaint  Patient presents with  . Palpitations   Problem List 1. Palpitations. 2. Essential HTN   History of Present Illness: Tracy Holt is a 41 y.o. female who presents for palpitations / fast HR  And a whooshing sound in leaft ear  Several Months of palpitations. They're not associated with any specific activity. Specifically there not associated with eating, drinking, change of position, or exercise.  These might last for a couple minutes. She thinks that it's a fairly regular tachycardia. Episodes spontaneously resolve after a couple minutes She describes a heaviness in her chest during the palpitations. She denies any dizziness or shortness breath.  Happens several times a day  Not assocated with any pattern   Happens at all times through the day.  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   Oct 30, 2016:  Meda seen today for follow-up visit. She wore an event monitor and was found have episodes of sinus tachycardia. The lisinopril HCTZ was discontinued and we started her on carvedilol 6.25 mg twice a day.     Past Medical History:  Diagnosis Date  . Colonic polyp   . Costochondritis   . Depression   . Elevated blood pressure (not hypertension)   . History of lump in breast   . Hypertension   . UC (ulcerative colitis) [facility])     Past Surgical History:  Procedure Laterality Date  . CESAREAN SECTION    . COLONOSCOPY W/ POLYPECTOMY    . TUBAL LIGATION       Current Outpatient Prescriptions  Medication Sig Dispense Refill  . carvedilol (COREG) 6.25 MG tablet Take 1 tablet (6.25 mg total) by mouth 2 (two) times daily. 60 tablet 11   No current facility-administered medications for this visit.     PAD Screen 09/13/2016  Previous PAD dx? No  Previous surgical procedure?  No  Pain with walking? Yes  Subsides with rest? No  Feet/toe relief with dangling? No  Painful, non-healing ulcers? No  Extremities discolored? No      Allergies:   Patient has no known allergies.    Social History:  The patient  reports that she quit smoking about 3 years ago. She has never used smokeless tobacco. She reports that she drinks alcohol. She reports that she does not use drugs.   Family History:  The patient's family history includes Cancer in her paternal grandmother; Diabetes in her father and mother; Heart disease in her paternal grandfather; Heart failure in her paternal grandmother; Hyperlipidemia in her father; Hypertension in her father and mother; Thyroid disease in her mother.    ROS:  Please see the history of present illness.    Review of Systems: Constitutional:  denies fever, chills, diaphoresis, appetite change and fatigue.  HEENT: denies photophobia, eye pain, redness, hearing loss, ear pain, congestion, sore throat, rhinorrhea, sneezing, neck pain, neck stiffness and tinnitus.  Respiratory: denies SOB, DOE, cough, chest tightness, and wheezing.  Cardiovascular: denies chest pain, palpitations and leg swelling.  Gastrointestinal: denies nausea, vomiting, abdominal pain, diarrhea, constipation, blood in stool.  Genitourinary: denies dysuria, urgency, frequency, hematuria, flank pain and difficulty urinating.  Musculoskeletal: denies  myalgias, back pain, joint swelling, arthralgias and gait problem.   Skin: denies pallor, rash and wound.  Neurological: denies dizziness, seizures,  syncope, weakness, light-headedness, numbness and headaches.   Hematological: denies adenopathy, easy bruising, personal or family bleeding history.  Psychiatric/ Behavioral: denies suicidal ideation, mood changes, confusion, nervousness, sleep disturbance and agitation.       All other systems are reviewed and negative.    PHYSICAL EXAM: VS:  BP 140/86   Pulse 72   Ht  5' 6.75" (1.695 m)   Wt 250 lb 6.4 oz (113.6 kg)   BMI 39.51 kg/m  , BMI Body mass index is 39.51 kg/m. GEN: Well nourished, well developed, in no acute distress  HEENT: normal  Neck: no JVD, carotid bruits, or masses Cardiac: RRR; no murmurs, rubs, or gallops,no edema  Respiratory:  clear to auscultation bilaterally, normal work of breathing GI: soft, nontender, nondistended, + BS MS: no deformity or atrophy  Skin: warm and dry, no rash Neuro:  Strength and sensation are intact Psych: normal   EKG:  EKG is ordered today. The ekg ordered today demonstrates  NSR at 78.   Normal ECG     Recent Labs: 08/21/2016: ALT 18; BUN 16; Creat 0.81; Hemoglobin 13.3; Platelets 282; Potassium 4.1; Sodium 140 09/13/2016: TSH 2.03    Lipid Panel    Component Value Date/Time   CHOL 185 09/13/2016 0841   TRIG 94 09/13/2016 0841   HDL 60 09/13/2016 0841   CHOLHDL 3.1 09/13/2016 0841   VLDL 19 09/13/2016 0841   LDLCALC 106 (H) 09/13/2016 0841      Wt Readings from Last 3 Encounters:  10/30/16 250 lb 6.4 oz (113.6 kg)  09/13/16 244 lb 12.8 oz (111 kg)  09/11/16 245 lb (111.1 kg)      Other studies Reviewed: Additional studies/ records that were reviewed today include: . Review of the above records demonstrates:    ASSESSMENT AND PLAN:  1.  Palpitations: She was found have sinus tachycardia on the event monitor. We started her on carvedilol 6.25 mg twice a day. Her palpitations seem to be better. We will increase the carvedilol to 12.5 mg twice a day.    2. Essential HTN Her blood pressures fairly well-controlled.  3.   ? Bruit.   She has a whooshing sound on the left side of her neck that corresponds to her HR  I did not hear a bruit but this may be a "bruit equivalent " Will get a carotid duplex scan .    Current medicines are reviewed at length with the patient today.  The patient does not have concerns regarding medicines.  Labs/ tests ordered today include:   No orders  of the defined types were placed in this encounter.  Disposition:   FU with me in 6 months   Mertie Moores, MD  10/30/2016 8:37 AM    [facility] [address], [place], [place]  [address] Phone: [phone]; Fax: [phone]

## 2016-10-30 NOTE — Patient Instructions (Signed)
Medication Instructions:  INCREASE Carvedilol (Coreg) to 12.5 mg twice daily   Labwork: None Ordered   Testing/Procedures: Your physician has requested that you have a carotid duplex. This test is an ultrasound of the carotid arteries in your neck. It looks at blood flow through these arteries that supply the brain with blood. Allow one hour for this exam. There are no restrictions or special instructions.   Follow-Up: Your physician wants you to follow-up in: 6 months with Dr. Acie Fredrickson.  You will receive a reminder letter in the mail two months in advance. If you don't receive a letter, please call our office to schedule the follow-up appointment.   If you need a refill on your cardiac medications before your next appointment, please call your pharmacy.   Thank you for choosing [facility]! Christen Bame, RN [phone]

## 2016-11-14 ENCOUNTER — Ambulatory Visit (HOSPITAL_COMMUNITY)
Admission: RE | Admit: 2016-11-14 | Discharge: 2016-11-14 | Disposition: A | Discharge status: Home / Self Care | Payer: BLUE CROSS/BLUE SHIELD | Source: Ambulatory Visit | Attending: Cardiology | Admitting: Cardiology

## 2016-11-14 DIAGNOSIS — R0989 Other specified symptoms and signs involving the circulatory and respiratory systems: Secondary | ICD-10-CM

## 2016-11-14 DIAGNOSIS — Z136 Encounter for screening for cardiovascular disorders: Secondary | ICD-10-CM | POA: Diagnosis not present

## 2016-11-16 ENCOUNTER — Encounter: Payer: Self-pay | Admitting: Orthopaedic Surgery

## 2016-11-16 DIAGNOSIS — M23222 Derangement of posterior horn of medial meniscus due to old tear or injury, left knee: Secondary | ICD-10-CM | POA: Diagnosis not present

## 2016-11-16 DIAGNOSIS — M659 Synovitis and tenosynovitis, unspecified: Secondary | ICD-10-CM | POA: Diagnosis not present

## 2016-11-16 DIAGNOSIS — S83242D Other tear of medial meniscus, current injury, left knee, subsequent encounter: Secondary | ICD-10-CM | POA: Diagnosis not present

## 2016-11-16 DIAGNOSIS — G8918 Other acute postprocedural pain: Secondary | ICD-10-CM | POA: Diagnosis not present

## 2016-11-23 ENCOUNTER — Ambulatory Visit (INDEPENDENT_AMBULATORY_CARE_PROVIDER_SITE_OTHER): Payer: BLUE CROSS/BLUE SHIELD | Admitting: Orthopaedic Surgery

## 2016-11-23 DIAGNOSIS — Z9889 Other specified postprocedural states: Secondary | ICD-10-CM | POA: Insufficient documentation

## 2016-11-23 NOTE — Progress Notes (Signed)
The patient is following up 1 week after a left knee arthroscopy with a partial medial meniscectomy. She has small meniscal root tear of the posterior aspect the medial meniscus. 420, cartilage to be intact throughout her knee except for some chondral malacia of the patella. She is doing well postoperatively she's been ambulatory crutches.  On examination of her left operative knee the sutures been removed there is no evidence infection. She does have a mild effusion with good range of motion of her knee. The knee feels ligamentously stable.  At this point she'll continue try to slowly increase her activities. I will have her work on quad training exercises. We'll need to have her out of work until June 13 when [place] let her return to full work duties. I'll see her back myself for says she doing overall.

## 2016-12-21 ENCOUNTER — Ambulatory Visit (INDEPENDENT_AMBULATORY_CARE_PROVIDER_SITE_OTHER): Payer: BLUE CROSS/BLUE SHIELD | Admitting: Orthopaedic Surgery

## 2016-12-21 DIAGNOSIS — Z9889 Other specified postprocedural states: Secondary | ICD-10-CM

## 2016-12-21 NOTE — Progress Notes (Signed)
Tracy Holt is now 4 weeks excess post a left knee arthroscopy with a partial medial meniscectomy. She is doing well since surgery. She went back toward June 13. Said range of motion strength are increasing. She still having problems with pes bursitis on that left knee but overall she's been doing well.  On examination there is really no effusion of her knee. Both knees hyperextend range of motion is full her McMurray's and Lachman's are negative she has some swelling over the pes bursa area but is not painful to her today.  All questions were encouraged and answered. She is doing great from her knee standpoint and she'll continue increase her activities as comfort allows. She'll follow-up as needed.

## 2017-01-10 ENCOUNTER — Telehealth (INDEPENDENT_AMBULATORY_CARE_PROVIDER_SITE_OTHER): Payer: Self-pay | Admitting: Orthopaedic Surgery

## 2017-01-10 NOTE — Telephone Encounter (Signed)
I received a request for records from [facility] for records relating to weight loss.  I faxed back advising that we only treated her for the knee and that no notes mention "weight loss". Stated to call me if I could be of further assistance.

## 2017-01-12 DIAGNOSIS — Z6841 Body Mass Index (BMI) 40.0 and over, adult: Secondary | ICD-10-CM | POA: Diagnosis not present

## 2017-01-15 ENCOUNTER — Other Ambulatory Visit (HOSPITAL_COMMUNITY): Payer: Self-pay | Admitting: General Surgery

## 2017-01-18 ENCOUNTER — Telehealth (INDEPENDENT_AMBULATORY_CARE_PROVIDER_SITE_OTHER): Payer: Self-pay | Admitting: Orthopaedic Surgery

## 2017-01-18 NOTE — Telephone Encounter (Signed)
Pt called states left knee pain same as it was before the surgery in May 2018 and it is extending to the back of the knee as well. Pt states also some fluid. Gave pt appt 02/07/17 pt would like to be seen sooner due to recurring symptoms.

## 2017-01-19 NOTE — Telephone Encounter (Signed)
Made an appt with Artis Delay next wednesday

## 2017-01-24 ENCOUNTER — Encounter (INDEPENDENT_AMBULATORY_CARE_PROVIDER_SITE_OTHER): Payer: Self-pay | Admitting: Physician Assistant

## 2017-01-24 ENCOUNTER — Ambulatory Visit (INDEPENDENT_AMBULATORY_CARE_PROVIDER_SITE_OTHER): Payer: BLUE CROSS/BLUE SHIELD | Admitting: Physician Assistant

## 2017-01-24 DIAGNOSIS — M1712 Unilateral primary osteoarthritis, left knee: Secondary | ICD-10-CM

## 2017-01-24 MED ORDER — LIDOCAINE HCL 1 % IJ SOLN
3.0000 mL | INTRAMUSCULAR | Status: AC | PRN
  Administered 2017-01-24: 3 mL

## 2017-01-24 MED ORDER — METHYLPREDNISOLONE ACETATE 40 MG/ML IJ SUSP
40.0000 mg | INTRAMUSCULAR | Status: AC | PRN
  Administered 2017-01-24: 40 mg via INTRA_ARTICULAR

## 2017-01-24 NOTE — Progress Notes (Signed)
   Procedure Note  Patient: Tracy Holt             Date of Birth: [birth year]           MRN: [id number]             Visit Date: 01/24/2017 History of present illness Tracy Holt is status post left knee arthroscopy to assist May. At that time she had a knee arthroscopy with partial medial meniscectomy. She had a small root tear of the posterior aspect of the medial meniscus. Also she had some chondral malacia of the patella. She states she was doing well until the week after July 4 she started having left knee pain. She states it is similar to what she was having prior to the knee arthroscopy. She is unable to get comfortable due to the knee pain. She denies any mechanical symptoms or swelling. She's had no new injury to the knee. Posterior pain is in the posterior aspect of the knee. She states if she sits for prolonged period time she has definite hold the rising from the position but also just cannot get comfortable sitting. Steps aggravate the knee. She's taken some naproxen with some relief. Procedures: Visit Diagnoses: Primary osteoarthritis of left knee  Large Joint Inj Date/Time: 01/24/2017 4:08 PM Performed by: Pete Pelt Authorized by: Pete Pelt   Consent Given by:  Patient Indications:  Pain Location:  Knee Site:  L knee Needle Size:  22 G Needle Length:  1.5 inches Approach:  Anterolateral Ultrasound Guidance: No   Fluoroscopic Guidance: No   Medications:  3 mL lidocaine 1 %; 40 mg methylPREDNISolone acetate 40 MG/ML Aspiration Attempted: No   Patient tolerance:  Patient tolerated the procedure well with no immediate complications    Physical exam: Bilateral knees no effusion abnormal warmth erythema. No instability valgus varus stressing. She has a positive Phillips Odor on the left negative on the right. Crepitus peripatellar region both knees with passive range of motion. No significant tenderness along medial or lateral joint line of either knee. Left knee  with well-healed port sites.   Impression : LEFT knee patellofemoral chondromalacia.  Plan; We'll have her take naproxen 2 tablets 2 times daily with food. Follow up with for Synvisc one injection in the knee once available.

## 2017-01-25 ENCOUNTER — Encounter: Payer: Self-pay | Admitting: Skilled Nursing Facility1

## 2017-01-25 ENCOUNTER — Encounter: Payer: BLUE CROSS/BLUE SHIELD | Attending: General Surgery | Admitting: Skilled Nursing Facility1

## 2017-01-25 DIAGNOSIS — Z713 Dietary counseling and surveillance: Secondary | ICD-10-CM | POA: Diagnosis not present

## 2017-01-25 NOTE — Progress Notes (Signed)
Pre-Op Assessment Visit:  Pre-Operative Sleeve Gastrectomy Surgery  Medical Nutrition Therapy:  Appt start time: 8:00  End time:  9:00  Patient was seen on 01/25/2017 for Pre-Operative Nutrition Assessment. Assessment and letter of approval faxed to [facility] Surgery Bariatric Surgery Program coordinator on 01/25/2017.   Pt states she lives close to [place] but lives in [place] throug h the week.  Pt expectation of surgery: "a lifestyle change, forces you to stick to that diet"  Pt expectation of Dietitian: " to provide me with information on the right things to eat"  Start weight at [facility]: 244.1  BMI: 40.00  24 hr Dietary Recall: First Meal: skipped Snack: nuts Second Meal: fast food  Snack: nuts---granola bars---yogurt Third Meal: fast food----cereal----soup Snack: popcorn Beverages: water with lemon or crystal light, sundrop, sweet tea  Encouraged to engage in 150 minutes of moderate physical activity including cardiovascular and weight baring weekly  Handouts given during visit include:  . Pre-Op Goals . Bariatric Surgery Protein Shakes During the appointment today the following Pre-Op Goals were reviewed with the patient: . Maintain or lose weight as instructed by your surgeon . Make healthy food choices . Begin to limit portion sizes . Limited concentrated sugars and fried foods . Keep fat/sugar in the single digits per serving on             food labels . Practice CHEWING your food  (aim for 30 chews per bite or until applesauce consistency) . Practice not drinking 15 minutes before, during, and 30 minutes after each meal/snack . Avoid all carbonated beverages  . Avoid/limit caffeinated beverages  . Avoid all sugar-sweetened beverages . Consume 3 meals per day; eat every 3-5 hours . Make a list of non-food related activities . Aim for 64-100 ounces of FLUID daily  . Aim for at least 60-80 grams of PROTEIN daily . Look for a liquid protein source that  contain ?15 g protein and ?5 g carbohydrate  (ex: shakes, drinks, shots)  -Follow diet recommendations listed below   Energy and Macronutrient Recomendations: Calories: 1500 Carbohydrate: 170 Protein: 112 Fat: 42  Demonstrated degree of understanding via:  Teach Back  Teaching Method Utilized:  Visual Auditory Hands on  Barriers to learning/adherence to lifestyle change: none identified   Patient to call the [facility] to enroll in Pre-Op and Post-Op Nutrition Education when surgery date is scheduled.

## 2017-02-01 ENCOUNTER — Ambulatory Visit (HOSPITAL_COMMUNITY)
Admission: RE | Admit: 2017-02-01 | Discharge: 2017-02-01 | Disposition: A | Discharge status: Home / Self Care | Payer: BLUE CROSS/BLUE SHIELD | Source: Ambulatory Visit | Attending: General Surgery | Admitting: General Surgery

## 2017-02-01 ENCOUNTER — Encounter (HOSPITAL_COMMUNITY): Payer: Self-pay | Admitting: Radiology

## 2017-02-01 DIAGNOSIS — Z01818 Encounter for other preprocedural examination: Secondary | ICD-10-CM | POA: Diagnosis not present

## 2017-02-06 ENCOUNTER — Ambulatory Visit (INDEPENDENT_AMBULATORY_CARE_PROVIDER_SITE_OTHER): Payer: BLUE CROSS/BLUE SHIELD | Admitting: Orthopaedic Surgery

## 2017-02-08 DIAGNOSIS — F509 Eating disorder, unspecified: Secondary | ICD-10-CM | POA: Diagnosis not present

## 2017-03-05 ENCOUNTER — Ambulatory Visit: Payer: BLUE CROSS/BLUE SHIELD | Admitting: Skilled Nursing Facility1

## 2017-03-05 DIAGNOSIS — M1712 Unilateral primary osteoarthritis, left knee: Secondary | ICD-10-CM | POA: Diagnosis not present

## 2017-03-06 ENCOUNTER — Ambulatory Visit: Payer: BLUE CROSS/BLUE SHIELD | Admitting: Skilled Nursing Facility1

## 2017-03-07 ENCOUNTER — Encounter: Payer: Self-pay | Admitting: Skilled Nursing Facility1

## 2017-03-07 ENCOUNTER — Encounter: Payer: BLUE CROSS/BLUE SHIELD | Attending: General Surgery | Admitting: Skilled Nursing Facility1

## 2017-03-07 DIAGNOSIS — Z713 Dietary counseling and surveillance: Secondary | ICD-10-CM | POA: Diagnosis not present

## 2017-03-07 NOTE — Progress Notes (Signed)
  Assessment:   1st SWL Appointment.  Pt states she will be getting knee injections at the end of this month. Pt state she had a knee surgery in May for a tear. Pt states she is working on getting back into making lunch instead of eating after having been on vacation. Pt states she has cut out straws. Pt states she has been trying to chew more states it is difficulty.  Pt will work on identifying hunger verses appetite.   Surgery Type: Sleeve Gastrectomy  Start weight at [facility]: 244.1  Wt: 245.8 BMI: 40.28   MEDICATIONS: See List    DIETARY INTAKE:  24-hr recall:  B ( AM): protein shake or fruit Snk ( AM):  L ( PM): eating out  Snk ( PM): almonds pr peanut butter crackers  D ( PM): eating out (grilled chicken sandwich with salad instead of fries. Snk ( PM):  Beverages: water with flavorings   Usual physical activity: ADL's  Diet to Follow: 1500 calories 170 g carbohydrates 112 g protein 42 g fat   Nutritional Diagnosis:  -3.3 Overweight/obesity related to past poor dietary habits and physical inactivity as evidenced by patient w/ planned Sleeve gastrectomy surgery following dietary guidelines for continued weight loss.    Intervention:  Nutrition counseling for upcoming Bariatric Surgery. Goals: -Encouraged to engage in 150 minutes of moderate physical activity including cardiovascular and weight baring weekly -Keep working on chewing your food until applesauce consistency -Keep working on not drinking with meals -Work on eating until you are satisfied not full Teaching Method Utilized:  Visual Auditory Hands on  Barriers to learning/adherence to lifestyle change: none identified   Demonstrated degree of understanding via:  Teach Back   Monitoring/Evaluation:  Dietary intake, exercise,  and body weight prn.

## 2017-03-07 NOTE — Patient Instructions (Addendum)
-  Keep working on chewing your food until applesauce consistency  -Keep working on not drinking with meals  -Work on eating until you are satisfied not full

## 2017-03-12 ENCOUNTER — Telehealth (INDEPENDENT_AMBULATORY_CARE_PROVIDER_SITE_OTHER): Payer: Self-pay

## 2017-03-12 NOTE — Telephone Encounter (Signed)
Tracy Holt will be delivered on Thursday 03/15/17.

## 2017-03-16 DIAGNOSIS — F509 Eating disorder, unspecified: Secondary | ICD-10-CM | POA: Diagnosis not present

## 2017-03-19 ENCOUNTER — Ambulatory Visit: Payer: BLUE CROSS/BLUE SHIELD

## 2017-03-19 ENCOUNTER — Ambulatory Visit (INDEPENDENT_AMBULATORY_CARE_PROVIDER_SITE_OTHER): Payer: BLUE CROSS/BLUE SHIELD | Admitting: Orthopaedic Surgery

## 2017-03-19 ENCOUNTER — Encounter: Payer: BLUE CROSS/BLUE SHIELD | Admitting: Skilled Nursing Facility1

## 2017-03-19 DIAGNOSIS — M1712 Unilateral primary osteoarthritis, left knee: Secondary | ICD-10-CM

## 2017-03-19 DIAGNOSIS — Z713 Dietary counseling and surveillance: Secondary | ICD-10-CM | POA: Diagnosis not present

## 2017-03-19 MED ORDER — HYLAN G-F 20 48 MG/6ML IX SOSY
48.0000 mg | PREFILLED_SYRINGE | INTRA_ARTICULAR | Status: AC | PRN
  Administered 2017-03-19: 48 mg via INTRA_ARTICULAR

## 2017-03-19 NOTE — Progress Notes (Signed)
   Procedure Note  Patient: Tracy Holt             Date of Birth: [birth year]           MRN: [id number]             Visit Date: 03/19/2017  Procedures: Visit Diagnoses: No diagnosis found.  Large Joint Inj Date/Time: 03/19/2017 3:51 PM Performed by: Mcarthur Rossetti Authorized by: Mcarthur Rossetti   Location:  Knee Site:  L knee Ultrasound Guidance: No   Fluoroscopic Guidance: No   Arthrogram: No   Medications:  48 mg Hylan 48 MG/6ML  The patient is here for a scheduled hyaluronic acid injection of Synvisc 1 in her left knee. This is treatment moderate to mild arthritic pain and arthritis in general. She understands fully the risks and benefits of this injection having had this are described to her and ordered as well. She tolerated the injection well. She'll follow up as needed.

## 2017-03-20 ENCOUNTER — Encounter: Payer: Self-pay | Admitting: Skilled Nursing Facility1

## 2017-03-20 NOTE — Progress Notes (Signed)
Pre-Operative Nutrition Class:  Appt start time: 7673   End time:  1830.  Patient was seen on 03/19/2017 for Pre-Operative Bariatric Surgery Education at [facility].   Surgery date:  Surgery type: Sleeve Start weight at [facility]: 244.1 Weight today: 247.7  Samples given per MNT protocol. Patient educated on appropriate usage: Bariatric Advantage Calcium  Lot # [id number] Exp: nov-07-2016  Celebrate Vitamins Multivitamin Lot # [id number] Exp: 05/2018  Renee Pain Protein Powder   Lot #[id number] Exp: 08/2017  The following the learning objectives were met by the patient during this course:  Identify Pre-Op Dietary Goals and will begin 2 weeks pre-operatively  Identify appropriate sources of fluids and proteins   State protein recommendations and appropriate sources pre and post-operatively  Identify Post-Operative Dietary Goals and will follow for 2 weeks post-operatively  Identify appropriate multivitamin and calcium sources  Describe the need for physical activity post-operatively and will follow MD recommendations  State when to call healthcare provider regarding medication questions or post-operative complications  Handouts given during class include:  Pre-Op Bariatric Surgery Diet Handout  Protein Shake Handout  Post-Op Bariatric Surgery Nutrition Handout  BELT Program Information Flyer  Support Group Information Flyer  [facility] Bariatric Supplements Price List  Follow-Up Plan: Patient will follow-up at [facility] 2 weeks post operatively for diet advancement per MD.

## 2017-03-26 ENCOUNTER — Ambulatory Visit: Payer: Self-pay | Admitting: General Surgery

## 2017-03-26 DIAGNOSIS — Z6841 Body Mass Index (BMI) 40.0 and over, adult: Secondary | ICD-10-CM | POA: Diagnosis not present

## 2017-04-04 ENCOUNTER — Ambulatory Visit: Payer: BLUE CROSS/BLUE SHIELD | Admitting: Skilled Nursing Facility1

## 2017-05-08 NOTE — Patient Instructions (Signed)
Amal Renbarger  05/08/2017   Your procedure is scheduled on: 05-15-17  Report to [facility] Main  Entrance Take [place]  elevators to 3rd floor to  [place] at     [place] AM.    Call this number if you have problems the morning of surgery [phone]   Remember: ONLY 1 PERSON MAY GO WITH YOU TO SHORT STAY TO GET  READY MORNING OF [place].  Do not eat food or drink liquids :After Midnight.     Take these medicines the morning of surgery with A SIP OF WATER: Carvedilol                                You may not have any metal on your body including hair pins and              piercings  Do not wear jewelry, make-up, lotions, powders or perfumes, deodorant             Do not wear nail polish.  Do not shave  48 hours prior to surgery.     Do not bring valuables to the hospital. [place].  Contacts, dentures or bridgework may not be worn into surgery.  Leave suitcase in the car. After surgery it may be brought to your room.               Please read over the following fact sheets you were given: _____________________________________________________________________           [facility] - Preparing for Surgery Before surgery, you can play an important role.  Because skin is not sterile, your skin needs to be as free of germs as possible.  You can reduce the number of germs on your skin by washing with CHG (chlorahexidine gluconate) soap before surgery.  CHG is an antiseptic cleaner which kills germs and bonds with the skin to continue killing germs even after washing. Please DO NOT use if you have an allergy to CHG or antibacterial soaps.  If your skin becomes reddened/irritated stop using the CHG and inform your nurse when you arrive at Short Stay. Do not shave (including legs and underarms) for at least 48 hours prior to the first CHG shower.  You may shave your face/neck. Please follow these  instructions carefully:  1.  Shower with CHG Soap the night before surgery and the  morning of Surgery.  2.  If you choose to wash your hair, wash your hair first as usual with your  normal  shampoo.  3.  After you shampoo, rinse your hair and body thoroughly to remove the  shampoo.                           4.  Use CHG as you would any other liquid soap.  You can apply chg directly  to the skin and wash                       Gently with a scrungie or clean washcloth.  5.  Apply the CHG Soap to your body ONLY FROM THE NECK DOWN.   Do not use on face/ open  Wound or open sores. Avoid contact with eyes, ears mouth and genitals (private parts).                       Wash face,  Genitals (private parts) with your normal soap.             6.  Wash thoroughly, paying special attention to the area where your surgery  will be performed.  7.  Thoroughly rinse your body with warm water from the neck down.  8.  DO NOT shower/wash with your normal soap after using and rinsing off  the CHG Soap.                9.  Pat yourself dry with a clean towel.            10.  Wear clean pajamas.            11.  Place clean sheets on your bed the night of your first shower and do not  sleep with pets. Day of Surgery : Do not apply any lotions/deodorants the morning of surgery.  Please wear clean clothes to the hospital/surgery center.  FAILURE TO FOLLOW THESE INSTRUCTIONS MAY RESULT IN THE CANCELLATION OF YOUR SURGERY PATIENT SIGNATURE_________________________________  NURSE SIGNATURE__________________________________  ________________________________________________________________________   Adam Phenix  An incentive spirometer is a tool that can help keep your lungs clear and active. This tool measures how well you are filling your lungs with each breath. Taking long deep breaths may help reverse or decrease the chance of developing breathing (pulmonary) problems (especially  infection) following:  A long period of time when you are unable to move or be active. BEFORE THE PROCEDURE   If the spirometer includes an indicator to show your best effort, your nurse or respiratory therapist will set it to a desired goal.  If possible, sit up straight or lean slightly forward. Try not to slouch.  Hold the incentive spirometer in an upright position. INSTRUCTIONS FOR USE  1. Sit on the edge of your bed if possible, or sit up as far as you can in bed or on a chair. 2. Hold the incentive spirometer in an upright position. 3. Breathe out normally. 4. Place the mouthpiece in your mouth and seal your lips tightly around it. 5. Breathe in slowly and as deeply as possible, raising the piston or the ball toward the top of the column. 6. Hold your breath for 3-5 seconds or for as long as possible. Allow the piston or ball to fall to the bottom of the column. 7. Remove the mouthpiece from your mouth and breathe out normally. 8. Rest for a few seconds and repeat Steps 1 through 7 at least 10 times every 1-2 hours when you are awake. Take your time and take a few normal breaths between deep breaths. 9. The spirometer may include an indicator to show your best effort. Use the indicator as a goal to work toward during each repetition. 10. After each set of 10 deep breaths, practice coughing to be sure your lungs are clear. If you have an incision (the cut made at the time of surgery), support your incision when coughing by placing a pillow or rolled up towels firmly against it. Once you are able to get out of bed, walk around indoors and cough well. You may stop using the incentive spirometer when instructed by your caregiver.  RISKS AND COMPLICATIONS  Take your time so you do not get  dizzy or light-headed.  If you are in pain, you may need to take or ask for pain medication before doing incentive spirometry. It is harder to take a deep breath if you are having pain. AFTER  USE  Rest and breathe slowly and easily.  It can be helpful to keep track of a log of your progress. Your caregiver can provide you with a simple table to help with this. If you are using the spirometer at home, follow these instructions: [place] IF:   You are having difficultly using the spirometer.  You have trouble using the spirometer as often as instructed.  Your pain medication is not giving enough relief while using the spirometer.  You develop fever of 100.5 F (38.1 C) or higher. SEEK IMMEDIATE MEDICAL CARE IF:   You cough up bloody sputum that had not been present before.  You develop fever of 102 F (38.9 C) or greater.  You develop worsening pain at or near the incision site. MAKE SURE YOU:   Understand these instructions.  Will watch your condition.  Will get help right away if you are not doing well or get worse. Document Released: 10/23/2006 Document Revised: 09/04/2011 Document Reviewed: 12/24/2006 [facility] Patient Information 2014 [place], [place].   ________________________________________________________________________

## 2017-05-09 ENCOUNTER — Encounter (HOSPITAL_COMMUNITY)
Admission: RE | Admit: 2017-05-09 | Discharge: 2017-05-09 | Disposition: A | Discharge status: Home / Self Care | Payer: BLUE CROSS/BLUE SHIELD | Source: Ambulatory Visit | Attending: General Surgery | Admitting: General Surgery

## 2017-05-09 ENCOUNTER — Other Ambulatory Visit: Payer: Self-pay

## 2017-05-09 ENCOUNTER — Encounter (HOSPITAL_COMMUNITY): Payer: Self-pay

## 2017-05-09 DIAGNOSIS — Z01812 Encounter for preprocedural laboratory examination: Secondary | ICD-10-CM | POA: Diagnosis not present

## 2017-05-09 DIAGNOSIS — Z6838 Body mass index (BMI) 38.0-38.9, adult: Secondary | ICD-10-CM | POA: Insufficient documentation

## 2017-05-09 HISTORY — DX: Sleep apnea, unspecified: G47.30

## 2017-05-09 HISTORY — DX: Anemia, unspecified: D64.9

## 2017-05-09 LAB — COMPREHENSIVE METABOLIC PANEL
ALT: 27 U/L (ref 14–54)
AST: 26 U/L (ref 15–41)
Albumin: 4.2 g/dL (ref 3.5–5.0)
Alkaline Phosphatase: 63 U/L (ref 38–126)
Anion gap: 5 (ref 5–15)
BUN: 13 mg/dL (ref 6–20)
CALCIUM: 9 mg/dL (ref 8.9–10.3)
CO2: 25 mmol/L (ref 22–32)
CREATININE: 0.68 mg/dL (ref 0.44–1.00)
Chloride: 108 mmol/L (ref 101–111)
Glucose, Bld: 97 mg/dL (ref 65–99)
Potassium: 4 mmol/L (ref 3.5–5.1)
SODIUM: 138 mmol/L (ref 135–145)
Total Bilirubin: 0.9 mg/dL (ref 0.3–1.2)
Total Protein: 7.6 g/dL (ref 6.5–8.1)

## 2017-05-09 LAB — CBC WITH DIFFERENTIAL/PLATELET
BASOS ABS: 0 10*3/uL (ref 0.0–0.1)
Basophils Relative: 0 %
EOS ABS: 0.1 10*3/uL (ref 0.0–0.7)
EOS PCT: 1 %
HCT: 40.8 % (ref 36.0–46.0)
Hemoglobin: 13.9 g/dL (ref 12.0–15.0)
LYMPHS ABS: 2 10*3/uL (ref 0.7–4.0)
Lymphocytes Relative: 30 %
MCH: 29.8 pg (ref 26.0–34.0)
MCHC: 34.1 g/dL (ref 30.0–36.0)
MCV: 87.4 fL (ref 78.0–100.0)
MONO ABS: 0.6 10*3/uL (ref 0.1–1.0)
Monocytes Relative: 9 %
Neutro Abs: 4 10*3/uL (ref 1.7–7.7)
Neutrophils Relative %: 60 %
PLATELETS: 264 10*3/uL (ref 150–400)
RBC: 4.67 MIL/uL (ref 3.87–5.11)
RDW: 13.1 % (ref 11.5–15.5)
WBC: 6.6 10*3/uL (ref 4.0–10.5)

## 2017-05-09 NOTE — Progress Notes (Signed)
ekg 02/01/17 epic cxr 02/01/17 epic

## 2017-05-14 NOTE — H&P (Signed)
History of Present Illness Marland Kitchen T. Ammy Lienhard MD; 03/26/2017 5:00 PM) The patient is a 41 year old female who presents with obesity. Patient returns for a preoperative visit prior to planned laparoscopic sleeve gastrectomy. Her original presentation was as follows:  Patient is referred by Dr Mack Hook for consideration for surgical treatment for morbid obesity. The patient gives a history of progressive obesity since childhood despite multiple attempts at medical management. She has been able to lose up to 40 or 50 pounds with diet and exercise programs in the past but then experiences progressive weight regain. Obesity has been affecting the patient in a number of ways including difficulty with activities of daily living and enjoying activities with her family. Significant co-morbid illnesses have developed including hypertension and chronic joint pain. She is concerned about her long-term health going forward at her current weight. The patient has been to our initial information seminar, researched surgical options thoroughly, and is interested in sleeve gastrectomy. She has not had any previous abdominal surgery other than C-section. No significant chronic GI complaints. Does not require medications for reflux. She has been diagnosed with sleep apnea at a previous sleep study but has not been able to be compliant with CPAP.  She has successfully completed her preoperative workup. No concerns on nutrition or psychologic evaluation. We reviewed her upper GI series that was normal without hiatal hernia.    Problem List/Past Medical Marland Kitchen T. Keevan Wolz, MD; 03/26/2017 5:00 PM) MORBID OBESITY WITH BMI OF 40.0-44.9, ADULT (E66.01)  H. PYLORI INFECTION (A04.8)   Past Surgical History Marland Kitchen T. Alianis Trimmer, MD; 03/26/2017 5:00 PM) Breast Biopsy  Right. Cesarean Section - Multiple  Colon Polyp Removal - Colonoscopy  Foot Surgery  Right. Knee Surgery  Left.  Diagnostic Studies  History Marland Kitchen T. Dudley Mages, MD; 03/26/2017 5:00 PM) Colonoscopy  1-5 years ago Mammogram  1-3 years ago Pap Smear  1-5 years ago  Allergies (Tanisha A. Owens Shark, [place]; 03/26/2017 4:37 PM) No Known Drug Allergies 01/12/2017 Allergies Reconciled   Medication History (Tanisha A. Owens Shark, [place]; 03/26/2017 4:37 PM) Carvedilol (12.5MG Tablet, Oral) Active. Medications Reconciled  Social History Marland Kitchen T. Yanette Tripoli, MD; 03/26/2017 5:00 PM) Alcohol use  Occasional alcohol use. Caffeine use  Coffee, Tea. No drug use  Tobacco use  Former smoker.  Family History Marland Kitchen T. Clovis Mankins, MD; 03/26/2017 5:00 PM) Alcohol Abuse  Family Members In General. Arthritis  Family Members In General, Father, Mother. Colon Polyps  Father. Depression  Brother. Diabetes Mellitus  Family Members In General, Father, Mother. Heart Disease  Family Members In General. Heart disease in female family member before age 23  Heart disease in female family member before age 63  Hypertension  Family Members In General, Father, Mother. Melanoma  Family Members In General. Ovarian Cancer  Family Members In General. Thyroid problems  Mother.  Pregnancy / Birth History Marland Kitchen T. Josslyn Ciolek, MD; 03/26/2017 5:00 PM) Age at menarche  58 years. Gravida  2 Irregular periods  Length (months) of breastfeeding  3-6 Maternal age  42-25 Para  2  Other Problems Marland Kitchen T. Indiyah Paone, MD; 03/26/2017 5:00 PM) Anxiety Disorder  Arthritis  High blood pressure  Sleep Apnea  Ulcerative Colitis   Vitals (Tanisha A. Brown RMA; 03/26/2017 4:37 PM) 03/26/2017 4:36 PM Weight: 244.6 lb Height: 65in Body Surface Area: 2.15 m Body Mass Index: 40.7 kg/m  Temp.: 98.23F  Pulse: 82 (Regular)  BP: 128/82 (Sitting, Left Arm, Standard)       Physical Exam Marland Kitchen T. Tyce Delcid MD; 03/26/2017 5:01  PM) The physical exam findings are as follows: Note:General: Alert, obese Caucasian female, in no  distress Skin: Warm and dry without rash or infection. HEENT: No palpable masses or thyromegaly. Sclera nonicteric. Pupils equal round and reactive. Lungs: Breath sounds clear and equal. No wheezing or increased work of breathing. Cardiovascular: Regular rate and rhythm without murmer. No JVD or edema. Peripheral pulses intact. No carotid bruits. Abdomen: Nondistended. Soft and nontender. No masses palpable. No organomegaly. No palpable hernias. Extremities: No edema or joint swelling or deformity. No chronic venous stasis changes. Neurologic: Alert and fully oriented. Gait normal. No focal weakness. Psychiatric: Normal mood and affect. Thought content appropriate with normal judgement and insight    Assessment & Plan Marland Kitchen T. Shaneta Cervenka MD; 03/26/2017 5:02 PM) MORBID OBESITY WITH BMI OF 40.0-44.9, ADULT (E66.01) Impression: Patient with progressive morbid obesity unresponsive to multiple efforts at medical management who presents with a BMI of 40 and comorbidities of obstructive sleep apnea, chronic joint pain and hypertension. I believe there would be very significant medical benefit from surgical weight loss. After our discussion of surgical options currently available the patient has decided to proceed with laparoscopic sleeve gastrectomy due to the reasons above. We have discussed the nature of the problem and the risks of remaining morbidly obese. We discussed laparoscopic sleeve gastrectomy in detail including the nature of the procedure, expected hospitalization and recovery, and major risks of anesthetic complications, bleeding, blood clots and pulmonary emboli, leakage and infection and long-term risks of stricture, , bowel obstruction, reflux, nutritional deficiencies, and failure to lose weight or weight regain. We discussed these problems could lead to death. We discussed that the procedure is not reversible. We discussed possible need for conversion to gastric bypass or other procedure.  She has successfully completed her preoperative workup and ready to proceed with surgery. We reviewed the consent form today and all her questions were answered. She is given prescriptions for Protonix and Zofran and oxycodone liquid.

## 2017-05-15 ENCOUNTER — Encounter (HOSPITAL_COMMUNITY): Payer: Self-pay | Admitting: *Deleted

## 2017-05-15 ENCOUNTER — Inpatient Hospital Stay (HOSPITAL_COMMUNITY)
Admission: RE | Admit: 2017-05-15 | Discharge: 2017-05-16 | DRG: 621 | Disposition: A | Discharge status: Home / Self Care | Payer: BLUE CROSS/BLUE SHIELD | Source: Ambulatory Visit | Attending: General Surgery | Admitting: General Surgery

## 2017-05-15 ENCOUNTER — Inpatient Hospital Stay (HOSPITAL_COMMUNITY): Payer: BLUE CROSS/BLUE SHIELD | Admitting: Certified Registered Nurse Anesthetist

## 2017-05-15 ENCOUNTER — Encounter (HOSPITAL_COMMUNITY)
Admission: RE | Disposition: A | Discharge status: Home / Self Care | Payer: Self-pay | Source: Ambulatory Visit | Attending: General Surgery

## 2017-05-15 ENCOUNTER — Other Ambulatory Visit: Payer: Self-pay

## 2017-05-15 DIAGNOSIS — G8929 Other chronic pain: Secondary | ICD-10-CM | POA: Diagnosis present

## 2017-05-15 DIAGNOSIS — Z87891 Personal history of nicotine dependence: Secondary | ICD-10-CM | POA: Diagnosis not present

## 2017-05-15 DIAGNOSIS — G4733 Obstructive sleep apnea (adult) (pediatric): Secondary | ICD-10-CM | POA: Diagnosis not present

## 2017-05-15 DIAGNOSIS — Z79899 Other long term (current) drug therapy: Secondary | ICD-10-CM

## 2017-05-15 DIAGNOSIS — Z808 Family history of malignant neoplasm of other organs or systems: Secondary | ICD-10-CM

## 2017-05-15 DIAGNOSIS — Z8249 Family history of ischemic heart disease and other diseases of the circulatory system: Secondary | ICD-10-CM

## 2017-05-15 DIAGNOSIS — Z6841 Body Mass Index (BMI) 40.0 and over, adult: Secondary | ICD-10-CM

## 2017-05-15 DIAGNOSIS — I10 Essential (primary) hypertension: Secondary | ICD-10-CM | POA: Diagnosis present

## 2017-05-15 DIAGNOSIS — M1990 Unspecified osteoarthritis, unspecified site: Secondary | ICD-10-CM | POA: Diagnosis present

## 2017-05-15 DIAGNOSIS — Z8261 Family history of arthritis: Secondary | ICD-10-CM | POA: Diagnosis not present

## 2017-05-15 DIAGNOSIS — M1712 Unilateral primary osteoarthritis, left knee: Secondary | ICD-10-CM | POA: Diagnosis not present

## 2017-05-15 DIAGNOSIS — Z86010 Personal history of colonic polyps: Secondary | ICD-10-CM

## 2017-05-15 DIAGNOSIS — D72829 Elevated white blood cell count, unspecified: Secondary | ICD-10-CM | POA: Diagnosis not present

## 2017-05-15 DIAGNOSIS — Z833 Family history of diabetes mellitus: Secondary | ICD-10-CM | POA: Diagnosis not present

## 2017-05-15 DIAGNOSIS — Z8041 Family history of malignant neoplasm of ovary: Secondary | ICD-10-CM

## 2017-05-15 DIAGNOSIS — Z811 Family history of alcohol abuse and dependence: Secondary | ICD-10-CM

## 2017-05-15 DIAGNOSIS — Z818 Family history of other mental and behavioral disorders: Secondary | ICD-10-CM

## 2017-05-15 DIAGNOSIS — Z8371 Family history of colonic polyps: Secondary | ICD-10-CM | POA: Diagnosis not present

## 2017-05-15 DIAGNOSIS — F419 Anxiety disorder, unspecified: Secondary | ICD-10-CM | POA: Diagnosis present

## 2017-05-15 HISTORY — PX: LAPAROSCOPIC GASTRIC SLEEVE RESECTION: SHX5895

## 2017-05-15 LAB — HEMOGLOBIN AND HEMATOCRIT, BLOOD
HCT: 39.4 % (ref 36.0–46.0)
HEMOGLOBIN: 13.5 g/dL (ref 12.0–15.0)

## 2017-05-15 LAB — PREGNANCY, URINE: PREG TEST UR: NEGATIVE

## 2017-05-15 SURGERY — GASTRECTOMY, SLEEVE, LAPAROSCOPIC
Anesthesia: General

## 2017-05-15 MED ORDER — BUPIVACAINE-EPINEPHRINE 0.25% -1:200000 IJ SOLN
INTRAMUSCULAR | Status: AC
  Filled 2017-05-15: qty 1

## 2017-05-15 MED ORDER — ROCURONIUM BROMIDE 50 MG/5ML IV SOSY
PREFILLED_SYRINGE | INTRAVENOUS | Status: AC
  Filled 2017-05-15: qty 5

## 2017-05-15 MED ORDER — EVICEL 5 ML EX KIT
PACK | Freq: Once | CUTANEOUS | Status: DC
  Filled 2017-05-15: qty 1

## 2017-05-15 MED ORDER — CHLORHEXIDINE GLUCONATE CLOTH 2 % EX PADS: 6.0000 | MEDICATED_PAD | Freq: Once | CUTANEOUS | Status: DC

## 2017-05-15 MED ORDER — EPHEDRINE SULFATE 50 MG/ML IJ SOLN
INTRAMUSCULAR | Status: DC | PRN
  Administered 2017-05-15: 5 mg via INTRAVENOUS
  Administered 2017-05-15 (×3): 10 mg via INTRAVENOUS

## 2017-05-15 MED ORDER — BUPIVACAINE LIPOSOME 1.3 % IJ SUSP
20.0000 mL | Freq: Once | INTRAMUSCULAR | Status: AC
  Administered 2017-05-15: 20 mL
  Filled 2017-05-15: qty 20

## 2017-05-15 MED ORDER — MORPHINE SULFATE (PF) 2 MG/ML IV SOLN
1.0000 mg | INTRAVENOUS | Status: DC | PRN
  Administered 2017-05-15: 2 mg via INTRAVENOUS
  Filled 2017-05-15: qty 1

## 2017-05-15 MED ORDER — BUPIVACAINE-EPINEPHRINE 0.25% -1:200000 IJ SOLN
INTRAMUSCULAR | Status: DC | PRN
  Administered 2017-05-15: 50 mL

## 2017-05-15 MED ORDER — LACTATED RINGERS IV SOLN
INTRAVENOUS | Status: DC
  Administered 2017-05-15 (×2): via INTRAVENOUS

## 2017-05-15 MED ORDER — PROPOFOL 10 MG/ML IV BOLUS
INTRAVENOUS | Status: AC
  Filled 2017-05-15: qty 20

## 2017-05-15 MED ORDER — LIDOCAINE 2% (20 MG/ML) 5 ML SYRINGE
INTRAMUSCULAR | Status: AC
  Filled 2017-05-15: qty 5

## 2017-05-15 MED ORDER — FENTANYL CITRATE (PF) 250 MCG/5ML IJ SOLN
INTRAMUSCULAR | Status: AC
  Filled 2017-05-15: qty 5

## 2017-05-15 MED ORDER — ACETAMINOPHEN 160 MG/5ML PO SOLN: 650.0000 mg | ORAL | Status: DC | PRN

## 2017-05-15 MED ORDER — KETAMINE HCL 10 MG/ML IJ SOLN
INTRAMUSCULAR | Status: DC | PRN
  Administered 2017-05-15: 30 mg via INTRAVENOUS

## 2017-05-15 MED ORDER — MEPERIDINE HCL 50 MG/ML IJ SOLN: 6.2500 mg | INTRAMUSCULAR | Status: DC | PRN

## 2017-05-15 MED ORDER — DEXAMETHASONE SODIUM PHOSPHATE 10 MG/ML IJ SOLN
INTRAMUSCULAR | Status: DC | PRN
  Administered 2017-05-15: 6 mg via INTRAVENOUS

## 2017-05-15 MED ORDER — CARVEDILOL 12.5 MG PO TABS
12.5000 mg | ORAL_TABLET | Freq: Two times a day (BID) | ORAL | Status: DC
  Administered 2017-05-15 – 2017-05-16 (×2): 12.5 mg via ORAL
  Filled 2017-05-15 (×2): qty 1

## 2017-05-15 MED ORDER — POTASSIUM CHLORIDE 2 MEQ/ML IV SOLN
INTRAVENOUS | Status: DC
  Administered 2017-05-15 – 2017-05-16 (×2): via INTRAVENOUS
  Filled 2017-05-15 (×4): qty 1000

## 2017-05-15 MED ORDER — HYDROMORPHONE HCL 1 MG/ML IJ SOLN
INTRAMUSCULAR | Status: AC
  Filled 2017-05-15: qty 1

## 2017-05-15 MED ORDER — CEFOTETAN DISODIUM-DEXTROSE 2-2.08 GM-%(50ML) IV SOLR
2.0000 g | INTRAVENOUS | Status: AC
  Administered 2017-05-15: 2 g via INTRAVENOUS
  Filled 2017-05-15: qty 50

## 2017-05-15 MED ORDER — ONDANSETRON HCL 4 MG/2ML IJ SOLN
INTRAMUSCULAR | Status: AC
  Filled 2017-05-15: qty 2

## 2017-05-15 MED ORDER — PROPOFOL 10 MG/ML IV BOLUS
INTRAVENOUS | Status: DC | PRN
  Administered 2017-05-15: 150 mg via INTRAVENOUS

## 2017-05-15 MED ORDER — 0.9 % SODIUM CHLORIDE (POUR BTL) OPTIME
TOPICAL | Status: DC | PRN
  Administered 2017-05-15: 1000 mL

## 2017-05-15 MED ORDER — ONDANSETRON HCL 4 MG/2ML IJ SOLN
INTRAMUSCULAR | Status: DC | PRN
  Administered 2017-05-15: 4 mg via INTRAVENOUS

## 2017-05-15 MED ORDER — MIDAZOLAM HCL 2 MG/2ML IJ SOLN
INTRAMUSCULAR | Status: AC
  Filled 2017-05-15: qty 2

## 2017-05-15 MED ORDER — ENOXAPARIN SODIUM 30 MG/0.3ML ~~LOC~~ SOLN
30.0000 mg | Freq: Two times a day (BID) | SUBCUTANEOUS | Status: DC
  Administered 2017-05-15 – 2017-05-16 (×2): 30 mg via SUBCUTANEOUS
  Filled 2017-05-15 (×2): qty 0.3

## 2017-05-15 MED ORDER — SUGAMMADEX SODIUM 500 MG/5ML IV SOLN
INTRAVENOUS | Status: DC | PRN
  Administered 2017-05-15: 300 mg via INTRAVENOUS

## 2017-05-15 MED ORDER — LIDOCAINE 2% (20 MG/ML) 5 ML SYRINGE
INTRAMUSCULAR | Status: DC | PRN
  Administered 2017-05-15: 1.5 mg/kg/h via INTRAVENOUS

## 2017-05-15 MED ORDER — ONDANSETRON HCL 4 MG/2ML IJ SOLN: 4.0000 mg | Freq: Once | INTRAMUSCULAR | Status: DC | PRN

## 2017-05-15 MED ORDER — PREMIER PROTEIN SHAKE
2.0000 [oz_av] | ORAL | Status: DC
  Administered 2017-05-16: 2 [oz_av] via ORAL

## 2017-05-15 MED ORDER — ROCURONIUM BROMIDE 50 MG/5ML IV SOSY
PREFILLED_SYRINGE | INTRAVENOUS | Status: DC | PRN
  Administered 2017-05-15: 10 mg via INTRAVENOUS
  Administered 2017-05-15: 50 mg via INTRAVENOUS
  Administered 2017-05-15 (×2): 10 mg via INTRAVENOUS

## 2017-05-15 MED ORDER — MIDAZOLAM HCL 5 MG/5ML IJ SOLN
INTRAMUSCULAR | Status: DC | PRN
  Administered 2017-05-15 (×2): 1 mg via INTRAVENOUS

## 2017-05-15 MED ORDER — SODIUM CHLORIDE 0.9 % IJ SOLN
INTRAMUSCULAR | Status: AC
  Filled 2017-05-15: qty 50

## 2017-05-15 MED ORDER — HYDROMORPHONE HCL 1 MG/ML IJ SOLN
0.2500 mg | INTRAMUSCULAR | Status: DC | PRN
  Administered 2017-05-15 (×4): 0.5 mg via INTRAVENOUS

## 2017-05-15 MED ORDER — ONDANSETRON HCL 4 MG/2ML IJ SOLN: 4.0000 mg | INTRAMUSCULAR | Status: DC | PRN

## 2017-05-15 MED ORDER — SUGAMMADEX SODIUM 500 MG/5ML IV SOLN
INTRAVENOUS | Status: AC
  Filled 2017-05-15: qty 5

## 2017-05-15 MED ORDER — EVICEL 5 ML EX KIT
PACK | CUTANEOUS | Status: DC | PRN
  Administered 2017-05-15: 1

## 2017-05-15 MED ORDER — SODIUM CHLORIDE 0.9 % IJ SOLN
INTRAMUSCULAR | Status: DC | PRN
  Administered 2017-05-15: 50 mL

## 2017-05-15 MED ORDER — OXYCODONE HCL 5 MG/5ML PO SOLN: 5.0000 mg | ORAL | Status: DC | PRN

## 2017-05-15 MED ORDER — STERILE WATER FOR IRRIGATION IR SOLN
Status: DC | PRN
  Administered 2017-05-15: 1000 mL

## 2017-05-15 MED ORDER — HEPARIN SODIUM (PORCINE) 5000 UNIT/ML IJ SOLN
5000.0000 [IU] | INTRAMUSCULAR | Status: AC
  Administered 2017-05-15: 5000 [IU] via SUBCUTANEOUS
  Filled 2017-05-15: qty 1

## 2017-05-15 MED ORDER — DEXAMETHASONE SODIUM PHOSPHATE 10 MG/ML IJ SOLN
INTRAMUSCULAR | Status: AC
  Filled 2017-05-15: qty 1

## 2017-05-15 MED ORDER — PANTOPRAZOLE SODIUM 40 MG IV SOLR
40.0000 mg | Freq: Every day | INTRAVENOUS | Status: DC
  Administered 2017-05-15: 40 mg via INTRAVENOUS
  Filled 2017-05-15: qty 40

## 2017-05-15 MED ORDER — APREPITANT 40 MG PO CAPS
40.0000 mg | ORAL_CAPSULE | ORAL | Status: AC
  Administered 2017-05-15: 40 mg via ORAL
  Filled 2017-05-15: qty 1

## 2017-05-15 MED ORDER — FENTANYL CITRATE (PF) 100 MCG/2ML IJ SOLN
INTRAMUSCULAR | Status: DC | PRN
  Administered 2017-05-15 (×4): 50 ug via INTRAVENOUS

## 2017-05-15 SURGICAL SUPPLY — 55 items
APPLICATOR COTTON TIP 6IN STRL (MISCELLANEOUS) IMPLANT
APPLIER CLIP ROT 10 11.4 M/L (STAPLE)
APPLIER CLIP ROT 13.4 12 LRG (CLIP)
BLADE SURG SZ11 CARB STEEL (BLADE) ×2 IMPLANT
CABLE HIGH FREQUENCY MONO STRZ (ELECTRODE) ×2 IMPLANT
CHLORAPREP W/TINT 26ML (MISCELLANEOUS) ×2 IMPLANT
CLIP APPLIE ROT 10 11.4 M/L (STAPLE) IMPLANT
CLIP APPLIE ROT 13.4 12 LRG (CLIP) IMPLANT
DERMABOND ADVANCED (GAUZE/BANDAGES/DRESSINGS) ×1
DERMABOND ADVANCED .7 DNX12 (GAUZE/BANDAGES/DRESSINGS) ×1 IMPLANT
DEVICE SUT QUICK LOAD TK 5 (STAPLE) IMPLANT
DEVICE SUT TI-KNOT TK 5X26 (MISCELLANEOUS) IMPLANT
DEVICE SUTURE ENDOST 10MM (ENDOMECHANICALS) IMPLANT
DRAPE UTILITY XL STRL (DRAPES) ×4 IMPLANT
ELECT REM PT RETURN 15FT ADLT (MISCELLANEOUS) ×2 IMPLANT
GAUZE SPONGE 4X4 12PLY STRL (GAUZE/BANDAGES/DRESSINGS) IMPLANT
GLOVE BIOGEL PI IND STRL 7.5 (GLOVE) ×1 IMPLANT
GLOVE BIOGEL PI INDICATOR 7.5 (GLOVE) ×1
GLOVE ECLIPSE 7.5 STRL STRAW (GLOVE) ×2 IMPLANT
GOWN STRL REUS W/TWL XL LVL3 (GOWN DISPOSABLE) ×8 IMPLANT
GRASPER SUT TROCAR 14GX15 (MISCELLANEOUS) IMPLANT
HOVERMATT SINGLE USE (MISCELLANEOUS) ×2 IMPLANT
KIT BASIN OR (CUSTOM PROCEDURE TRAY) ×2 IMPLANT
MARKER SKIN DUAL TIP RULER LAB (MISCELLANEOUS) ×2 IMPLANT
NEEDLE SPNL 22GX3.5 QUINCKE BK (NEEDLE) ×2 IMPLANT
PACK UNIVERSAL I (CUSTOM PROCEDURE TRAY) ×2 IMPLANT
RELOAD STAPLER BLUE 60MM (STAPLE) ×3 IMPLANT
RELOAD STAPLER GOLD 60MM (STAPLE) ×2 IMPLANT
RELOAD STAPLER GREEN 60MM (STAPLE) ×1 IMPLANT
SCISSORS LAP 5X45 EPIX DISP (ENDOMECHANICALS) ×2 IMPLANT
SET IRRIG TUBING LAPAROSCOPIC (IRRIGATION / IRRIGATOR) ×2 IMPLANT
SHEARS HARMONIC ACE PLUS 45CM (MISCELLANEOUS) ×2 IMPLANT
SLEEVE ADV FIXATION 5X100MM (TROCAR) ×2 IMPLANT
SLEEVE GASTRECTOMY 36FR VISIGI (MISCELLANEOUS) ×2 IMPLANT
SOLUTION ANTI FOG 6CC (MISCELLANEOUS) ×2 IMPLANT
SPONGE LAP 18X18 X RAY DECT (DISPOSABLE) ×2 IMPLANT
STAPLER ECHELON LONG 60 440 (INSTRUMENTS) ×2 IMPLANT
STAPLER RELOAD BLUE 60MM (STAPLE) ×6
STAPLER RELOAD GOLD 60MM (STAPLE) ×4
STAPLER RELOAD GREEN 60MM (STAPLE) ×2
SUT MNCRL AB 4-0 PS2 18 (SUTURE) ×2 IMPLANT
SUT SURGIDAC NAB ES-9 0 48 120 (SUTURE) ×2 IMPLANT
SUT VIC AB 0 BRD 54 (SUTURE) ×2 IMPLANT
SYR 10ML ECCENTRIC (SYRINGE) ×2 IMPLANT
SYR 20CC LL (SYRINGE) ×4 IMPLANT
SYSTEM WECK SHIELD CLOSURE (TROCAR) ×2 IMPLANT
TIP RIGID 35CM EVICEL (HEMOSTASIS) ×2 IMPLANT
TOWEL OR 17X26 10 PK STRL BLUE (TOWEL DISPOSABLE) ×2 IMPLANT
TOWEL OR NON WOVEN STRL DISP B (DISPOSABLE) ×2 IMPLANT
TROCAR ADV FIXATION 5X100MM (TROCAR) ×2 IMPLANT
TROCAR BLADELESS 15MM (ENDOMECHANICALS) ×2 IMPLANT
TROCAR BLADELESS OPT 5 100 (ENDOMECHANICALS) ×2 IMPLANT
TUBING CONNECTING 10 (TUBING) ×2 IMPLANT
TUBING ENDO SMARTCAP PENTAX (MISCELLANEOUS) ×2 IMPLANT
TUBING INSUF HEATED (TUBING) ×2 IMPLANT

## 2017-05-15 NOTE — Interval H&P Note (Signed)
History and Physical Interval Note:  05/15/2017 10:55 AM  Tracy Holt  has presented today for surgery, with the diagnosis of Morbid Obesity, HTN, OSA  The various methods of treatment have been discussed with the patient and family. After consideration of risks, benefits and other options for treatment, the patient has consented to  Procedure(s): LAPAROSCOPIC GASTRIC SLEEVE RESECTION (N/A) as a surgical intervention .  The patient's history has been reviewed, patient examined, no change in status, stable for surgery.  I have reviewed the patient's chart and labs.  Questions were answered to the patient's satisfaction.     Darene Lamer [name]

## 2017-05-15 NOTE — Progress Notes (Addendum)
Discussed post op day goals with patient including ambulation, IS, diet progression, pain, and nausea control.  Questions answered. 

## 2017-05-15 NOTE — Addendum Note (Signed)
Addendum  created 05/15/17 1336 by Montel Clock, CRNA   Intraprocedure Staff edited

## 2017-05-15 NOTE — Anesthesia Procedure Notes (Signed)
Procedure Name: Intubation Date/Time: 05/15/2017 11:16 AM Performed by: West Pugh, CRNA Pre-anesthesia Checklist: Patient identified, Emergency Drugs available, Suction available, Patient being monitored and Timeout performed Patient Re-evaluated:Patient Re-evaluated prior to induction Oxygen Delivery Method: Circle system utilized Preoxygenation: Pre-oxygenation with 100% oxygen Induction Type: IV induction Ventilation: Mask ventilation without difficulty Laryngoscope Size: Mac and 4 Grade View: Grade I Tube type: Oral Tube size: 7.5 mm Number of attempts: 1 Airway Equipment and Method: Stylet Placement Confirmation: ETT inserted through vocal cords under direct vision,  positive ETCO2,  CO2 detector and breath sounds checked- equal and bilateral Secured at: 22 cm Tube secured with: Tape Dental Injury: Teeth and Oropharynx as per pre-operative assessment

## 2017-05-15 NOTE — Op Note (Signed)
Name:  [name] MRN: [id number] Date of Surgery: 05/15/2017  Preop Diagnosis:  Morbid Obesity  Postop Diagnosis:  Morbid Obesity (Weight - 244, BMI - 40.7), S/P Gastric Sleeve resection  Procedure:  Upper endoscopy  (Intraoperative)  Surgeon:  Alphonsa Overall, M.D.  Anesthesia:  GET  Indications for procedure: Tracy Holt is a 41 y.o. female whose primary care physician is Greenwood, Vickie L, NP-C and has completed a gastric sleeve resection today for weight loss by Dr. Excell Seltzer.  I am doing an intraoperative upper endoscopy to evaluate the gastric pouch after the sleeve gastrectomy.  Operative Note: The patient is under general anesthesia.  Dr. Excell Seltzer is laparoscoping the patient while I do an upper endoscopy to evaluate the stomach pouch.  With the patient intubated, I passed the Pentax upper endoscope without difficulty down the esophagus.  The esophagus was unremarkable.  The esophago-gastric junction was at 38 cm.    The mucosa of the stomach looked viable and the staple line was intact without bleeding.  I advanced the scope to the pylorus, but did not go through it.  While I insufflated the stomach pouch with air, Dr. Excell Seltzer  flooded the upper abdomen with saline to put the gastric pouch under saline.  There was no bubbling or evidence of a leak.  There was no evidence of narrowing of the pouch and the gastric sleeve looked tubular.  The scope was then withdrawn.  The esophagus was unremarkable and the patient tolerated the endoscopy without difficulty.  Alphonsa Overall, MD, [facility] Pager: [phone] Office phone:  [phone]

## 2017-05-15 NOTE — Transfer of Care (Signed)
Immediate Anesthesia Transfer of Care Note  Patient: Tracy Holt  Procedure(s) Performed: LAPAROSCOPIC GASTRIC SLEEVE RESECTION (N/A )  Patient Location: PACU  Anesthesia Type:General  Level of Consciousness: awake, alert , oriented and patient cooperative  Airway & Oxygen Therapy: Patient Spontanous Breathing and Patient connected to face mask  Post-op Assessment: Report given to RN, Post -op Vital signs reviewed and stable and Patient moving all extremities X 4  Post vital signs: Reviewed and stable  Last Vitals:  Vitals:   05/15/17 0917  BP: 139/90  Pulse: 75  Resp: 16  Temp: 36.9 C  SpO2: 97%    Last Pain:  Vitals:   05/15/17 0917  TempSrc: Oral      Patients Stated Pain Goal: 4 (37/90/24 0973)  Complications: No apparent anesthesia complications

## 2017-05-15 NOTE — Op Note (Signed)
Preoperative Diagnosis: Morbid Obesity, HTN, OSA  Postoprative Diagnosis: Morbid Obesity, HTN, OSA  Procedure: Procedure(s): LAPAROSCOPIC GASTRIC SLEEVE RESECTION   Surgeon: Excell Seltzer T   Assistants: Alphonsa Overall  Anesthesia:  General endotracheal anesthesia  Indications: Patient is a 41 year old female with progressive morbid obesity unresponsive to medical management who presents with a BMI of 40 with comorbidities of hypertension, obstructive sleep apnea and chronic joint pain.  After extensive preoperative evaluation and discussion detailed elsewhere she presents for laparoscopic sleeve gastrectomy for treatment of her morbid obesity.    Procedure Detail: The patient was taken to the operating room, placed in the supine position on the operating table, and general endotracheal anesthesia induced.  She received preoperative IV antibiotics.  ERAS protocol was used.  The abdomen was widely sterilely prepped and draped.  Patient timeout was performed and correct procedure verified.  Access was obtained with a 5 mm Optiview trocar in the left upper quadrant without difficulty and pneumoperitoneum established.  Under direct vision a 5 mm trocar was placed laterally in the right upper quadrant, a 15 mm trocar at the base of the falciform ligament and a 5 mm trocar above and to the left of the umbilicus for the camera port.  An additional 5 mm trocar was placed in the left upper quadrant.  Through the subxiphoid site the [facility] retractor was placed in the left lobe of the liver elevated with excellent exposure of the stomach and the hiatus.  Following this under direct vision I bilateral TAP block was performed with dilute Exparel.  The gastric dissection was begun at the mid greater curve dividing vasculature with the harmonic scalpel and the lesser sac was entered.  The dissection progressed proximally up along the fundus dividing short gastric vessels.  The dissection continued up to the  incisura and the fat pad was dissected.  The left crus was completely cleared.  There was no evidence of hiatal hernia.  After complete proximal dissection we continued distally along the greater curve to a point measured 5 cm from the pylorus.  Some filmy posterior attachments to the pancreas were divided completely freeing the stomach along its lesser curve vasculature.  The 25 French VISI G was passed orally and positioned at the pylorus and smoothly along the lesser curve with the stomach splayed out symmetrically and the tube placed on suction.  This lead was begun with an additional firing of the green load 60 mm Echelon stapler beginning about 5 cm from the pylorus and staying well away from the incisura.  A second firing using the gold 60 mm stapler was used to continue the sleeve approximately going past the incisura and allowing some extra room along with his EG tube at this point.  The sleeve was then completed using one additional firing of the gold load stapler and 3 firings of the 60 mm blue load stapler staying a little closer to the VISI G-tube proximally and with a final firing angling just out lateral to the esophageal fat pad at right angles to the fundus.  The sleeve was insufflated and under saline irrigation appeared symmetric and no evidence of leak.  There is the G-tube was removed.  Dr. Lucia Gaskins performed upper endoscopy showing no evidence of leak or twist or narrowing.  The staple line was coated with Evicel.  The sleeve specimen was brought out through the 15 mm trocar site after dilating this slightly and this was closed with interrupted 0 Vicryl.  The Nathanson retractor was removed  under direct vision.  All CO2 was evacuated and trochars removed.  Skin incisions were closed with some particular Monocryl and Dermabond.  Sponge needle and instrument counts were correct.    Findings: As above  Estimated Blood Loss:  Minimal         Drains: None  Blood Given: none           Specimens: Greater curvature of stomach        Complications:  * No complications entered in OR log *         Disposition: PACU - hemodynamically stable.         Condition: stable

## 2017-05-15 NOTE — Anesthesia Postprocedure Evaluation (Signed)
Anesthesia Post Note  Patient: Tracy Holt  Procedure(s) Performed: LAPAROSCOPIC GASTRIC SLEEVE RESECTION (N/A )     Patient location during evaluation: PACU Anesthesia Type: General Level of consciousness: awake and alert Pain management: pain level controlled Vital Signs Assessment: post-procedure vital signs reviewed and stable Respiratory status: spontaneous breathing, nonlabored ventilation, respiratory function stable and patient connected to nasal cannula oxygen Cardiovascular status: blood pressure returned to baseline and stable Postop Assessment: no apparent nausea or vomiting Anesthetic complications: no    Last Vitals:  Vitals:   05/15/17 1315 05/15/17 1326  BP: (!) 146/100   Pulse: (!) 57 62  Resp: 15 (!) 22  Temp:    SpO2: 100% 100%    Last Pain:  Vitals:   05/15/17 1326  TempSrc:   PainSc: 5                  Estuardo Frisbee DAVID

## 2017-05-15 NOTE — Anesthesia Preprocedure Evaluation (Signed)
Anesthesia Evaluation  Patient identified by MRN, date of birth, ID band Patient awake    Reviewed: Allergy & Precautions, NPO status , Patient's Chart, lab work & pertinent test results  Airway Mallampati: II  TM Distance: >3 FB Neck ROM: Full    Dental   Pulmonary sleep apnea , former smoker,    Pulmonary exam normal        Cardiovascular hypertension, Normal cardiovascular exam     Neuro/Psych    GI/Hepatic   Endo/Other    Renal/GU      Musculoskeletal   Abdominal   Peds  Hematology   Anesthesia Other Findings   Reproductive/Obstetrics                             Anesthesia Physical Anesthesia Plan  ASA: III  Anesthesia Plan: General   Post-op Pain Management:    Induction: Intravenous  PONV Risk Score and Plan: 3 and Midazolam, Dexamethasone and Ondansetron  Airway Management Planned: Oral ETT  Additional Equipment:   Intra-op Plan:   Post-operative Plan: Extubation in OR  Informed Consent: I have reviewed the patients History and Physical, chart, labs and discussed the procedure including the risks, benefits and alternatives for the proposed anesthesia with the patient or authorized representative who has indicated his/her understanding and acceptance.     Plan Discussed with: CRNA and Surgeon  Anesthesia Plan Comments:         Anesthesia Quick Evaluation

## 2017-05-15 NOTE — Discharge Instructions (Signed)
GASTRIC BYPASS/SLEEVE  Home Care Instructions   These instructions are to help you care for yourself when you go home.  Call: If you have any problems.  Call [phone] and ask for the surgeon on call  If you need immediate assistance come to the ER at [facility]. Tell the ER staff you are a new post-op gastric bypass or gastric sleeve patient  Signs and symptoms to report:  Severe  vomiting or nausea o If you cannot handle clear liquids for longer than 1 day, call your surgeon  Abdominal pain which does not get better after taking your pain medication  Fever greater than 100.4  F and chills  Heart rate over 100 beats a minute  Trouble breathing  Chest pain  Redness,  swelling, drainage, or foul odor at incision (surgical) sites  If your incisions open or pull apart  Swelling or pain in calf (lower leg)  Diarrhea (Loose bowel movements that happen often), frequent watery, uncontrolled bowel movements  Constipation, (no bowel movements for 3 days) if this happens: o Take Milk of Magnesia, 2 tablespoons by mouth, 3 times a day for 2 days if needed o Stop taking Milk of Magnesia once you have had a bowel movement o Call your doctor if constipation continues Or o Take Miralax  (instead of Milk of Magnesia) following the label instructions o Stop taking Miralax once you have had a bowel movement o Call your doctor if constipation continues  Anything you think is abnormal for you   Normal side effects after surgery:  Unable to sleep at night or unable to concentrate  Irritability  Being tearful (crying) or depressed  These are common complaints, possibly related to your anesthesia, stress of surgery, and change in lifestyle, that usually go away a few weeks after surgery. If these feelings continue, call your medical doctor.  Wound Care: You may have surgical glue, steri-strips, or staples over your incisions after surgery  Surgical glue: Looks like clear  film over your incisions and will wear off a little at a time  Steri-strips: Adhesive strips of tape over your incisions. You may notice a yellowish color on skin under the steri-strips. This is used to make the steri-strips stick better. Do not pull the steri-strips off - let them fall off  Staples: Staples may be removed before you leave the hospital o If you go home with staples, call [facility] for an appointment with your surgeons nurse to have staples removed 10 days after surgery, [phone]  Showering: You may shower two (2) days after your surgery unless your surgeon tells you differently o Wash gently around incisions with warm soapy water, rinse well, and gently pat dry o If you have a drain (tube from your incision), you may need someone to hold this while you shower o No tub baths until staples are removed and incisions are healed   Medications:  Medications should be liquid or crushed if larger than the size of a dime  Extended release pills (medication that releases a little bit at a time through the  day) should not be crushed  Depending on the size and number of medications you take, you may need to space (take a few throughout the day)/change the time you take your medications so that you do not over-fill your pouch (smaller stomach)  Make sure you follow-up with you primary care physician to make medication changes needed during rapid weight loss and life -style changes  If you have diabetes, follow up with your doctor that orders your diabetes medication(s) within one week after surgery and check your blood sugar regularly   Do not drive while taking narcotics (pain medications)   Do not take acetaminophen (Tylenol) and Roxicet or Lortab Elixir at the same time since these pain medications contain acetaminophen   Diet:  First 2 Weeks You will see the nutritionist about two (2) weeks after your surgery. The nutritionist will increase the types of  foods you can eat if you are handling liquids well:  If you have severe vomiting or nausea and cannot handle clear liquids lasting longer than 1 day call your surgeon Protein Shake  Drink at least 2 ounces of shake 5-6 times per day  Each serving of protein shakes (usually 8-12 ounces) should have a minimum of: o 15 grams of protein o And no more than 5 grams of carbohydrate  Goal for protein each day: o Men = 80 grams per day o Women = 60 grams per day     Protein powder may be added to fluids such as non-fat milk or Lactaid milk or Soy milk (limit to 35 grams added protein powder per serving)  Hydration  Slowly increase the amount of water and other clear liquids as tolerated (See Acceptable Fluids)  Slowly increase the amount of protein shake as tolerated  Sip fluids slowly and throughout the day  May use sugar substitutes in small amounts (no more than 6-8 packets per day; i.e. Splenda)  Fluid Goal  The first goal is to drink at least 8 ounces of protein shake/drink per day (or as directed by the nutritionist); some examples of protein shakes are [employer], [employer], EAS Edge HP, and Unjury. - See handout from pre-op Bariatric Education Class: o Slowly increase the amount of protein shake you drink as tolerated o You may find it easier to slowly sip shakes throughout the day o It is important to get your proteins in first  Your fluid goal is to drink 64-100 ounces of fluid daily o It may take a few weeks to build up to this   32 oz. (or more) should be clear liquids And  32 oz. (or more) should be full liquids (see below for examples)  Liquids should not contain sugar, caffeine, or carbonation  Clear Liquids:  Water of Sugar-free flavored water (i.e. Fruit HO, Propel)  Decaffeinated coffee or tea (sugar-free)  Crystal lite, Wylers Lite, Minute Maid Lite  Sugar-free Jell-O  Bouillon or broth  Sugar-free Popsicle:    - Less than 20 calories  each; Limit 1 per day  Full Liquids:                   Protein Shakes/Drinks + 2 choices per day of other full liquids  Full liquids must be: o No More Than 12 grams of Carbs per serving o No More Than 3 grams of Fat per serving  Strained low-fat cream soup  Non-Fat milk  Fat-free Lactaid Milk  Sugar-free yogurt (Dannon Lite & Fit, Greek yogurt)    Vitamins and Minerals  Start 1 day after surgery unless otherwise directed by your surgeon  2 Chewable Multivitamin / Multimineral Supplement with iron   Chewable Calcium Citrate with Vitamin D-3 (Example: 3 Chewable Calcium  Plus 600 with Vitamin D-3) o Take 500 mg three (3) times a day for a total of 1500 mg each day o Do not take all 3 doses of calcium  at one time as it may cause constipation, and you can only absorb 500 mg at a time o Do not mix multivitamins containing iron with calcium supplements;  take 2 hours apart  Menstruating women and those at risk for anemia ( a blood disease that causes weakness) may need extra iron o Talk to your doctor to see if you need more iron  If you need extra iron: Total daily Iron recommendation (including Vitamins) is 50 to 100 mg Iron/day  Do not stop taking or change any vitamins or minerals until you talk to your nutritionist or surgeon  Your nutritionist and/or surgeon must approve all vitamin and mineral supplements   Activity and Exercise: It is important to continue walking at home. Limit your physical activity as instructed by your doctor. During this time, use these guidelines:  Do not lift anything greater than ten  (10) pounds for at least two (2) weeks  Do not go back to work or drive until Engineer, production says you can  You may have sex when you feel comfortable o It is VERY important for female patients to use a reliable birth control method; fertility often increase after surgery o Do not get pregnant for at least 18 months  Start exercising as soon as your doctor tells  you that you can o Make sure your doctor approves any physical activity  Start with a simple walking program  Walk 5-15 minutes each day, 7 days per week  Slowly increase until you are walking 30-45 minutes per day  Consider joining our [place] program. [phone] or email [email or web address]   Special Instructions Things to remember:  Use your CPAP when sleeping if this applies to you  Consider buying a medical alert bracelet that says you had lap-band surgery     You will likely have your first fill (fluid added to your band) 6 - 8 weeks after surgery  [facility] has a free Bariatric Surgery Support Group that meets monthly, the 3rd Thursday, [place]. You can see classes online at [email or web address]  It is very important to keep all follow up appointments with your surgeon, nutritionist, primary care physician, and behavioral health practitioner o After the first year, please follow up with your bariatric surgeon and nutritionist at least once a year in order to maintain best weight loss results                    [facility]:  Theresa: [phone]               Bariatric Nurse Coordinator: [phone]  Gastric Bypass/Sleeve Home Care Instructions  Rev. 07/2012                                                         Reviewed and Endorsed                                                    by [facility] Patient Education Committee, Jan, 2014

## 2017-05-16 ENCOUNTER — Encounter (HOSPITAL_COMMUNITY): Payer: Self-pay | Admitting: General Surgery

## 2017-05-16 LAB — CBC WITH DIFFERENTIAL/PLATELET
BASOS ABS: 0 10*3/uL (ref 0.0–0.1)
BASOS PCT: 0 %
EOS ABS: 0 10*3/uL (ref 0.0–0.7)
Eosinophils Relative: 0 %
HCT: 41.1 % (ref 36.0–46.0)
HEMOGLOBIN: 13.8 g/dL (ref 12.0–15.0)
Lymphocytes Relative: 9 %
Lymphs Abs: 1.6 10*3/uL (ref 0.7–4.0)
MCH: 29.6 pg (ref 26.0–34.0)
MCHC: 33.6 g/dL (ref 30.0–36.0)
MCV: 88.2 fL (ref 78.0–100.0)
Monocytes Absolute: 1.3 10*3/uL — ABNORMAL HIGH (ref 0.1–1.0)
Monocytes Relative: 7 %
NEUTROS ABS: 14.7 10*3/uL — AB (ref 1.7–7.7)
NEUTROS PCT: 84 %
Platelets: 313 10*3/uL (ref 150–400)
RBC: 4.66 MIL/uL (ref 3.87–5.11)
RDW: 13.2 % (ref 11.5–15.5)
WBC: 17.6 10*3/uL — AB (ref 4.0–10.5)

## 2017-05-16 NOTE — Plan of Care (Signed)
Nutrition Education Note  Received consult for diet education per DROP protocol.   Discussed 2 week post op diet with pt. Emphasized that liquids must be non carbonated, non caffeinated, and sugar free. Fluid goals discussed. Pt to follow up with outpatient bariatric RD for further diet progression after 2 weeks. Multivitamins and minerals also reviewed. Teach back method used, pt expressed understanding, expect good compliance.   Diet: First 2 Weeks  You will see the nutritionist about two (2) weeks after your surgery. The nutritionist will increase the types of foods you can eat if you are handling liquids well:  If you have severe vomiting or nausea and cannot handle clear liquids lasting longer than 1 day, call your surgeon  Protein Shake  Drink at least 2 ounces of shake 5-6 times per day  Each serving of protein shakes (usually 8 - 12 ounces) should have a minimum of:  15 grams of protein  And no more than 5 grams of carbohydrate  Goal for protein each day:  Men = 80 grams per day  Women = 60 grams per day  Protein powder may be added to fluids such as non-fat milk or Lactaid milk or Soy milk (limit to 35 grams added protein powder per serving)   Hydration  Slowly increase the amount of water and other clear liquids as tolerated (See Acceptable Fluids)  Slowly increase the amount of protein shake as tolerated  Sip fluids slowly and throughout the day  May use sugar substitutes in small amounts (no more than 6 - 8 packets per day; i.e. Splenda)   Fluid Goal  The first goal is to drink at least 8 ounces of protein shake/drink per day (or as directed by the nutritionist); some examples of protein shakes are Premier Protein, Syntrax Nectar, Adkins Advantage, EAS Edge HP, and Unjury. See handout from pre-op Bariatric Education Class:  Slowly increase the amount of protein shake you drink as tolerated  You may find it easier to slowly sip shakes throughout the day  It is important to  get your proteins in first  Your fluid goal is to drink 64 - 100 ounces of fluid daily  It may take a few weeks to build up to this  32 oz (or more) should be clear liquids  And  32 oz (or more) should be full liquids (see below for examples)  Liquids should not contain sugar, caffeine, or carbonation   Clear Liquids:  Water or Sugar-free flavored water (i.e. Fruit H2O, Propel)  Decaffeinated coffee or tea (sugar-free)  Crystal Lite, Wyler?s Lite, Minute Maid Lite  Sugar-free Jell-O  Bouillon or broth  Sugar-free Popsicle: *Less than 20 calories each; Limit 1 per day   Full Liquids:  Protein Shakes/Drinks + 2 choices per day of other full liquids  Full liquids must be:  No More Than 12 grams of Carbs per serving  No More Than 3 grams of Fat per serving  Strained low-fat cream soup  Non-Fat milk  Fat-free Lactaid Milk  Sugar-free yogurt (Dannon Lite & Fit, Greek yogurt, Oikos Zero)   Karim Aiello, MS, RD, LDN [place] [facility] Clinical Dietitian Pager: [phone] After Hours Pager: [phone]   

## 2017-05-16 NOTE — Progress Notes (Signed)
Patient alert and oriented, Post op day 1.  Provided support and encouragement.  Encouraged pulmonary toilet, ambulation and small sips of liquids.  Patient completed 12 ounces of clear fluid and began protein shakes.  All questions answered.  Will continue to monitor.

## 2017-05-16 NOTE — Progress Notes (Signed)
Patient alert and oriented, pain is controlled. Patient is tolerating fluids, advanced to protein shake today, patient is tolerating well.  Reviewed Gastric sleeve discharge instructions with patient and patient is able to articulate understanding.  Provided information on BELT program, Support Group and [facility] outpatient pharmacy. All questions answered, will continue to monitor.  

## 2017-05-16 NOTE — Progress Notes (Signed)
Patient ID: Tracy Holt, female   DOB: 1975/10/31, 41 y.o.   MRN: [id number] 1 Day Post-Op   Subjective: Doing well this morning.  Denies pain, just sore.  No nausea.  Tolerating water without difficulty  Objective: Vital signs in last 24 hours: Temp:  [97.7 F (36.5 C)-98.9 F (37.2 C)] 98.9 F (37.2 C) (11/21 0556) Pulse Rate:  [55-75] 60 (11/21 0556) Resp:  [13-22] 18 (11/21 0556) BP: (125-166)/(66-100) 136/68 (11/21 0556) SpO2:  [95 %-100 %] 95 % (11/21 0556) Weight:  [108.9 kg (240 lb)-114.6 kg (252 lb 11.2 oz)] 114.6 kg (252 lb 11.2 oz) (11/21 0556) Last BM Date: 05/14/17  Intake/Output from previous day: 11/20 0701 - 11/21 0700 In: 2693.3 [P.O.:300; I.V.:2393.3] Out: 1850 [Urine:1850] Intake/Output this shift: No intake/output data recorded.  General appearance: alert, cooperative and no distress GI: normal findings: soft, non-tender Incision/Wound: No erythema or drainage  Lab Results:  Recent Labs    05/15/17 1726 05/16/17 0602  WBC  --  17.6*  HGB 13.5 13.8  HCT 39.4 41.1  PLT  --  313   BMET No results for input(s): NA, K, CL, CO2, GLUCOSE, BUN, CREATININE, CALCIUM in the last 72 hours.   Studies/Results: No results found.  Anti-infectives: Anti-infectives (From admission, onward)   Start     Dose/Rate Route Frequency Ordered Stop   05/15/17 0917  cefoTEtan in Dextrose 5% (CEFOTAN) IVPB 2 g     2 g Intravenous On call to O.R. 05/15/17 0917 05/15/17 1149      Assessment/Plan: s/p Procedure(s): LAPAROSCOPIC GASTRIC SLEEVE RESECTION Doing well postoperatively without complication.  Has leukocytosis but likely just stress reaction as no other worrisome symptoms or findings.  Plan advance to a protein shake and discharge later today.   LOS: 1 day    Edward Jolly 05/16/2017

## 2017-05-16 NOTE — Discharge Summary (Signed)
  Patient ID: Tracy Holt [id number] 41 y.o. July 10, 1975  05/15/2017  Discharge date and time: 05/16/2017   Admitting Physician: Edward Jolly  Discharge Physician: Edward Jolly  Admission Diagnoses: Morbid Obesity, HTN, OSA  Discharge Diagnoses: Same  Operations: Procedure(s): LAPAROSCOPIC GASTRIC SLEEVE RESECTION  Admission Condition: good  Discharged Condition: good  Indication for Admission: Patient is a 41 year old female with progressive morbid obesity unresponsive to medical management who presents at a BMI of 40 and comorbidities of hypertension, chronic joint pain and obstructive sleep apnea.  After extensive preoperative workup and discussion detailed elsewhere she is admitted for scopic sleeve gastrectomy for treatment of her morbid obesity.   Hospital Course: The morning of admission the patient underwent an uneventful laparoscopic sleeve gastrectomy.  Her postoperative course was uncomplicated.  On the first postoperative day she has minimal discomfort and is tolerating fluids well.  Abdomen is benign and incisions healing well.  Plan is for discharge later today when tolerating protein shakes.  Disposition: Home  Patient Instructions:  Allergies as of 05/16/2017   No Known Allergies     Medication List    TAKE these medications   carvedilol 12.5 MG tablet Commonly known as:  COREG Take 1 tablet (12.5 mg total) by mouth 2 (two) times daily.       Activity: activity as tolerated Diet: Bariatric protein shakes Wound Care: none needed  Follow-up:  With Dr. Excell Seltzer in 3 weeks.  Signed: Edward Jolly MD, FACS  05/16/2017, 8:22 AM

## 2017-05-16 NOTE — Progress Notes (Signed)
Pt alert and oriented.  Tolerating protein with no complaints of pain, nausea, or vomiting.  D/c instructions were given, all questions answered.  Pt was discharged to home.

## 2017-05-21 ENCOUNTER — Telehealth (HOSPITAL_COMMUNITY): Payer: Self-pay

## 2017-05-21 NOTE — Telephone Encounter (Signed)
Follow up with bariatric surgical patient to discuss post discharge questions.  1.  Are you having any pain not relieved by pain medication?hasn't needed pain medication  2.  How much fluid total fluid intake have you had in the last 24/48 hours?   41 ounces of fluid per day 3.  How much protein intake have you had in the last 24/48 hours?55 grams of protein  4.  Have you had any trouble making urine?no trouble with urine  5.  Have you had nausea that has not been relieved by nausea medication?no  6.  Are you ambulating every hour?yes  7.  Are you passing gas or had a BM?BM everyday  8.  Do you know how to contact [facility]? [facility]? [facility]?yes  9.  Are you taking your vitamins and calcium without difficulty?no problems  10. Tell me how your incision looks?  Any redness, open incision, or drainage?look good  Talked to [facility] today about note to return to work

## 2017-05-29 ENCOUNTER — Encounter: Payer: BLUE CROSS/BLUE SHIELD | Attending: General Surgery | Admitting: Registered"

## 2017-05-29 DIAGNOSIS — Z713 Dietary counseling and surveillance: Secondary | ICD-10-CM | POA: Insufficient documentation

## 2017-05-29 DIAGNOSIS — E669 Obesity, unspecified: Secondary | ICD-10-CM

## 2017-05-31 NOTE — Progress Notes (Signed)
Bariatric Class:  Appt start time: 1530 end time:  1630.  2 Week Post-Operative Nutrition Class  Patient was seen on 05/29/2017 for Post-Operative Nutrition education at [facility].   Surgery date: 05/15/2017 Surgery type: Sleeve gastrectomy Start weight at [facility]: 244.1 Weight today: 226.4 Weight change: 17.7 lbs loss  Pt states she has experienced some abdominal pain. Pt states she is not getting 64 ounces of fluid a day.   TANITA  BODY COMP RESULTS  05/29/2017   BMI (kg/m^2) 37.1   Fat Mass (lbs) 113.2   Fat Free Mass (lbs) 113.2   Total Body Water (lbs) 82.8   The following the learning objectives were met by the patient during this course:  Identifies Phase 3A (Soft, High Proteins) Dietary Goals and will begin from 2 weeks post-operatively to 2 months post-operatively  Identifies appropriate sources of fluids and proteins   States protein recommendations and appropriate sources post-operatively  Identifies the need for appropriate texture modifications, mastication, and bite sizes when consuming solids  Identifies appropriate multivitamin and calcium sources post-operatively  Describes the need for physical activity post-operatively and will follow MD recommendations  States when to call healthcare provider regarding medication questions or post-operative complications  Handouts given during class include:  Phase 3A: Soft, High Protein Diet Handout  Follow-Up Plan: Patient will follow-up at [facility] in 6 weeks for 2 month post-op nutrition visit for diet advancement per MD.    

## 2017-06-11 ENCOUNTER — Ambulatory Visit (INDEPENDENT_AMBULATORY_CARE_PROVIDER_SITE_OTHER): Payer: BLUE CROSS/BLUE SHIELD | Admitting: Family Medicine

## 2017-06-11 ENCOUNTER — Encounter: Payer: Self-pay | Admitting: Family Medicine

## 2017-06-11 VITALS — BP 122/86 | HR 80 | Ht 65.0 in | Wt 221.0 lb

## 2017-06-11 DIAGNOSIS — I10 Essential (primary) hypertension: Secondary | ICD-10-CM | POA: Diagnosis not present

## 2017-06-11 DIAGNOSIS — Z79899 Other long term (current) drug therapy: Secondary | ICD-10-CM

## 2017-06-11 MED ORDER — LISINOPRIL 20 MG PO TABS: 20.0000 mg | ORAL_TABLET | Freq: Every day | ORAL | 1 refills | Status: DC

## 2017-06-11 NOTE — Progress Notes (Signed)
   Subjective:    Patient ID: Tracy Holt, female    DOB: 03-03-1976, 41 y.o.   MRN: [id number]  HPI Chief Complaint  Patient presents with  . Advice Only    was on lisinopril, then cardiologist switched her to carvedilol. Had gastic sleeve 11/20- but surgeon thought beta blocker was making her heart rate too low. So not on anything currently-needs to discuss.   . Flu Vaccine    declined.   She is here to follow up on HTN. Recently stopped her beta blocker due to having heart rate in the 50s and 40s and having a syncopal episode on 06/06/2017. Reports feeling fine and at baseline. No concerns today except she would like to start back on medication for HTN.  BP at home have been 113/84, 118/72, 117/94, 127/87, 123/88, 144/101, 120/88, 121/90, 125/90.   She was taking lisinopril/HCTZ prior to seeing her cardiologist for palpitations earlier this year. Her medication was changed to beta blocker at that time. States her BP was well controlled but then she had a gastric sleeve 4 weeks ago and has lost approximately 20 lbs so far.   Denies fever, chills, dizziness, chest pain, palpitations, shortness of breath, abdominal pain, N/V/D, urinary symptoms, LE edema.   Reviewed allergies, medications, past medical, surgical, family, and social history.  Review of Systems Pertinent positives and negatives in the history of present illness.     Objective:   Physical Exam BP 122/86 (BP Location: Left Arm, Cuff Size: Normal)   Pulse 80   Ht 5' 5"  (1.651 m)   Wt 221 lb (100.2 kg)   BMI 36.78 kg/m   Alert and oriented and in no acute distress. Not otherwise examined.       Assessment & Plan:  Essential hypertension - Plan: lisinopril (PRINIVIL,ZESTRIL) 20 MG tablet  Medication management - Plan: lisinopril (PRINIVIL,ZESTRIL) 20 MG tablet  She stopped her beta blocker per recommendations from her bariatric surgeon due to bradycardia.  I will start her on Lisinopril and have her follow up  with her cardiologist. She reports being overdue for her cardiologist visit. She will call me next week with her BP readings and follow up in 4 weeks with me or her cardiologist.

## 2017-06-15 ENCOUNTER — Encounter: Payer: Self-pay | Admitting: Family Medicine

## 2017-07-10 ENCOUNTER — Ambulatory Visit: Payer: BLUE CROSS/BLUE SHIELD | Admitting: Skilled Nursing Facility1

## 2017-07-11 ENCOUNTER — Encounter: Payer: BLUE CROSS/BLUE SHIELD | Attending: General Surgery | Admitting: Skilled Nursing Facility1

## 2017-07-11 ENCOUNTER — Encounter: Payer: Self-pay | Admitting: Skilled Nursing Facility1

## 2017-07-11 ENCOUNTER — Encounter: Payer: Self-pay | Admitting: Family Medicine

## 2017-07-11 DIAGNOSIS — Z713 Dietary counseling and surveillance: Secondary | ICD-10-CM | POA: Insufficient documentation

## 2017-07-11 DIAGNOSIS — E669 Obesity, unspecified: Secondary | ICD-10-CM

## 2017-07-11 NOTE — Patient Instructions (Addendum)
-  Aim to have vegetables with every lunch and every dinner every day  -Aim for 64 fluid ounces every day  -Do half of a serving of carbohydrates like grape nuts or oatmeal   -Only weigh ion Saturday morning

## 2017-07-11 NOTE — Progress Notes (Signed)
Follow-up visit:  8 Weeks Post-Operative Sleeve Gastrectomy Surgery  Primary concerns today: Post-operative Bariatric Surgery Nutrition Management.  Surgery date: 05/15/2017 Surgery type: Sleeve gastrectomy Start weight at [facility]: 244.1 Weight today: 208.4 Weight change: 18 lbs loss   Pt states she is not getting 64 ounces of fluid a day. Pt states now she watches tv while working out instead of eating on the couch. Pt states she has been weighing everyday and admits to eating oatmeal.   TANITA  BODY COMP RESULTS  05/29/2017 07/11/2017   BMI (kg/m^2) 37.1 34.7   Fat Mass (lbs) 113.2 101.4   Fat Free Mass (lbs) 113.2 107   Total Body Water (lbs) 82.8 77.4   24-hr recall: B (AM): protein shake or protein oatmeal  Snk (AM): almonds  L (PM): deli meat and cheese or beans and chicken or chic fila salad Snk (PM):  D (PM): chicken or pork with roasted vegetables or wendys chili Snk (PM):   Fluid intake: water or crystal light: 32 oz Estimated total protein intake: 60+  Medications: see list Supplementation: capsule multivitamin and calcium   Using straws: no Drinking while eating: no Having you been chewing well:yes Chewing/swallowing difficulties: no Changes in vision: no Changes to mood/headaches: no Hair loss/Cahnges to skin/Changes to nails: no Any difficulty focusing or concentrating: no Sweating: no Dizziness/Lightheaded: no Palpitations: no  Carbonated beverages: no N/V/D/C/GAS: having a bowel movement every 3rd day Abdominal Pain: no  Recent physical activity:  1.5 hours 5 days a week (with down time in between)  Progress Towards Goal(s):  In progress.  Handouts given during visit include:  Non-starchy veggies + protein    Nutritional Diagnosis:  [place]-3.3 Overweight/obesity related to past poor dietary habits and physical inactivity as evidenced by patient w/ recent sleeve gastrectomy surgery following dietary guidelines for continued weight loss.     Intervention:  Nutrition counseling.  Goals: -Aim to have vegetables with every lunch and every dinner every day -Aim for 64 fluid ounces every day -Do half of a serving of carbohydrates like grape nuts or oatmeal  -Only weigh in on Saturday morning   Teaching Method Utilized:  Visual Auditory Hands on  Barriers to learning/adherence to lifestyle change: none identified   Demonstrated degree of understanding via:  Teach Back   Monitoring/Evaluation:  Dietary intake, exercise, and body weight.

## 2017-08-14 ENCOUNTER — Other Ambulatory Visit: Payer: Self-pay | Admitting: Family Medicine

## 2017-08-14 DIAGNOSIS — Z79899 Other long term (current) drug therapy: Secondary | ICD-10-CM

## 2017-08-14 DIAGNOSIS — I10 Essential (primary) hypertension: Secondary | ICD-10-CM

## 2017-09-05 ENCOUNTER — Encounter: Payer: BLUE CROSS/BLUE SHIELD | Attending: General Surgery | Admitting: Skilled Nursing Facility1

## 2017-09-05 ENCOUNTER — Encounter: Payer: Self-pay | Admitting: Skilled Nursing Facility1

## 2017-09-05 DIAGNOSIS — Z713 Dietary counseling and surveillance: Secondary | ICD-10-CM | POA: Diagnosis not present

## 2017-09-05 DIAGNOSIS — E669 Obesity, unspecified: Secondary | ICD-10-CM

## 2017-09-05 NOTE — Progress Notes (Signed)
Post-Operative Sleeve Gastrectomy Surgery  Primary concerns today: Post-operative Bariatric Surgery Nutrition Management.  Surgery date: 05/15/2017 Surgery type: Sleeve gastrectomy Start weight at [facility]: 244.1 Weight today:187.6 Weight change: 20.8 lbs loss  Pt states she has been very tired since the last few weeks. Sleeping about 6-7 hours; work has been stressful. Pt states she sees her surgeon tomorrow. Pt states Facebook support group has been very stressful. Pt states food is now just something she has to do. Pt states she had corn and felt the corn caused pain in her stomach. Pt states she is Ok to add peas but does not want to add potatoes because they are bad. Pt states she eats fruit. Pt states her bowel movements have gotten better at every other day to every day. Pt states her parents and husband are doing keto diet.   TANITA  BODY COMP RESULTS  05/29/2017 07/11/2017 09/05/2017   BMI (kg/m^2) 37.1 34.7 31.2   Fat Mass (lbs) 113.2 101.4 83.2   Fat Free Mass (lbs) 113.2 107 104.4   Total Body Water (lbs) 82.8 77.4 74.8   24-hr recall: states she eats out often B (AM): protein shake or protein oatmeal or quest protein  Snk (AM): almonds  L (PM): deli meat and cheese or beans and chicken or chic fila salad or pintos and cheese from [employer] Snk (PM):  D (PM): chicken or pork with roasted vegetables or wendys chili or chic fila chicken and fruit Snk (PM):   Fluid intake: water, diet lemonade, or crystal light: 48-64 oz Estimated total protein intake: 60+  Medications: see list Supplementation: capsule multivitamin: procare and calcium and vitamin B12  Using straws: no Drinking while eating: no Having you been chewing well:yes Chewing/swallowing difficulties: no Changes in vision: no Changes to mood/headaches: no Hair loss/Cahnges to skin/Changes to nails: some thinning  Any difficulty focusing or concentrating: no Sweating: no Dizziness/Lightheaded: no Palpitations:  no  Carbonated beverages: no N/V/D/C/GAS: no Abdominal Pain: no  Recent physical activity:  30 min elliptical, 30 min work out with weights: 3-5 days Progress Towards Goal(s):  In progress.  Handouts given during visit include:  Non-starchy veggies + protein    Nutritional Diagnosis:  [place]-3.3 Overweight/obesity related to past poor dietary habits and physical inactivity as evidenced by patient w/ recent sleeve gastrectomy surgery following dietary guidelines for continued weight loss.    Intervention:  Nutrition counseling.  Goals: -Aim to have vegetables with every lunch and every dinner every day -Aim for 64 fluid ounces every day -Do half of a serving of carbohydrates like grape nuts or oatmeal  -Only weigh in on Saturday morning   Teaching Method Utilized:  Visual Auditory Hands on  Barriers to learning/adherence to lifestyle change: none identified   Demonstrated degree of understanding via:  Teach Back   Monitoring/Evaluation:  Dietary intake, exercise, and body weight.

## 2017-11-07 ENCOUNTER — Encounter (INDEPENDENT_AMBULATORY_CARE_PROVIDER_SITE_OTHER): Payer: Self-pay | Admitting: Orthopaedic Surgery

## 2017-11-07 ENCOUNTER — Ambulatory Visit (INDEPENDENT_AMBULATORY_CARE_PROVIDER_SITE_OTHER): Payer: BLUE CROSS/BLUE SHIELD | Admitting: Orthopaedic Surgery

## 2017-11-07 DIAGNOSIS — G8929 Other chronic pain: Secondary | ICD-10-CM | POA: Diagnosis not present

## 2017-11-07 DIAGNOSIS — M25562 Pain in left knee: Secondary | ICD-10-CM

## 2017-11-07 DIAGNOSIS — M5432 Sciatica, left side: Secondary | ICD-10-CM | POA: Diagnosis not present

## 2017-11-07 NOTE — Progress Notes (Signed)
Office Visit Note   Patient: Tracy Holt           Date of Birth: March 21, 1976           MRN: [id number] Visit Date: 11/07/2017              Requested by: Girtha Rm, NP-C [place], [place] [id number] PCP: Girtha Rm, NP-C   Assessment & Plan: Visit Diagnoses:  1. Chronic pain of left knee   2. Sciatica, left side     Plan: Given the significant weakness and radicular symptoms involving her left lower extremity an MRI is definitely warranted to rule out herniated disc or nerve compression based on clinical exam findings with the patient's reporting as well.  I do feel this medically necessary at this point given the profound weakness.  We will see her back after the MRI.  All questions concerns were answered and addressed.  Follow-Up Instructions: Return in about 2 weeks (around 11/21/2017).   Orders:  No orders of the defined types were placed in this encounter.  No orders of the defined types were placed in this encounter.     Procedures: No procedures performed   Clinical Data: No additional findings.   Subjective: Chief Complaint  Patient presents with  . Left Leg - Pain  The patient is well-known to me.  Is been a while since we have seen her though.  She had a history of a left knee arthroscopy.  She also has a history of being morbidly obese but now she is lost 80 pounds and had gastric sleeve surgery and is done very well from that.  She describes weakness in her left leg with decreased sensation lateral aspect in her foot.  She gets some back pain related to this but mainly its radicular symptoms involving her left leg.  This happened previously and is worsened slowly with time.  Even with significant weight loss she is experiencing worsening radicular symptoms going down the left leg with numbness and tingling as well as calf pain.  HPI  Review of Systems She currently denies any headache, chest pain, short of breath, fever, chills,  nausea, vomiting.  Objective: Vital Signs: There were no vitals taken for this visit.  Physical Exam She is alert and oriented x3 and in no acute distress Ortho Exam Examination of her left lower extremity does show decreased sensation in the L4 and L5 distribution special in the lateral aspect of her left leg comparing the right side.  She has significant weakness with foot dorsiflexion as well as great toe dorsiflexion.  Both of these are significantly weak compared to the opposite side.  She also has subjective numbness in the lateral aspect in the bottom of her left foot. Specialty Comments:  No specialty comments available.  Imaging: No results found.   PMFS History: Patient Active Problem List   Diagnosis Date Noted  . Unilateral primary osteoarthritis, left knee 03/19/2017  . Status post arthroscopy of left knee 11/23/2016  . Heart palpitations 09/13/2016  . Essential hypertension 12/29/2015  . Routine general medical examination at a health care facility 12/29/2015   Past Medical History:  Diagnosis Date  . Anemia    hx of  . Colonic polyp   . Costochondritis   . Elevated blood pressure (not hypertension)   . History of lump in breast   . Hypertension   . Sleep apnea    No machine  . UC (ulcerative colitis) ([place])  Family History  Problem Relation Age of Onset  . Thyroid disease Mother   . Diabetes Mother   . Hypertension Mother   . Diabetes Father   . Hyperlipidemia Father   . Hypertension Father   . Cancer Paternal Grandmother   . Heart failure Paternal Grandmother   . Heart disease Paternal Grandfather     Past Surgical History:  Procedure Laterality Date  . arthroscopic  knee     left  . CESAREAN SECTION     x2  . COLONOSCOPY W/ POLYPECTOMY     x2  . FOOT SURGERY     right plantar facitis surgery  . LAPAROSCOPIC GASTRIC SLEEVE RESECTION N/A 05/15/2017   Procedure: LAPAROSCOPIC GASTRIC SLEEVE RESECTION;  Surgeon: Excell Seltzer, MD;   Location: [facility];  Service: General;  Laterality: N/A;  . TUBAL LIGATION     Social History   Occupational History  . Not on file  Tobacco Use  . Smoking status: Former Smoker    Last attempt to quit: 05/30/2013    Years since quitting: 4.4  . Smokeless tobacco: Never Used  Substance and Sexual Activity  . Alcohol use: No    Frequency: Never  . Drug use: No  . Sexual activity: Yes    Birth control/protection: None    Comment: 2 kids. 1-2 cigs per day for intermittent for 10 years

## 2017-11-08 ENCOUNTER — Other Ambulatory Visit (INDEPENDENT_AMBULATORY_CARE_PROVIDER_SITE_OTHER): Payer: Self-pay

## 2017-11-08 DIAGNOSIS — M4807 Spinal stenosis, lumbosacral region: Secondary | ICD-10-CM

## 2017-11-16 ENCOUNTER — Ambulatory Visit
Admission: RE | Admit: 2017-11-16 | Discharge: 2017-11-16 | Disposition: A | Discharge status: Home / Self Care | Payer: BLUE CROSS/BLUE SHIELD | Source: Ambulatory Visit | Attending: Orthopaedic Surgery | Admitting: Orthopaedic Surgery

## 2017-11-16 DIAGNOSIS — M48061 Spinal stenosis, lumbar region without neurogenic claudication: Secondary | ICD-10-CM | POA: Diagnosis not present

## 2017-11-16 DIAGNOSIS — M4807 Spinal stenosis, lumbosacral region: Secondary | ICD-10-CM

## 2017-11-29 ENCOUNTER — Encounter (INDEPENDENT_AMBULATORY_CARE_PROVIDER_SITE_OTHER): Payer: Self-pay | Admitting: Orthopaedic Surgery

## 2017-11-29 ENCOUNTER — Ambulatory Visit (INDEPENDENT_AMBULATORY_CARE_PROVIDER_SITE_OTHER): Payer: BLUE CROSS/BLUE SHIELD | Admitting: Orthopaedic Surgery

## 2017-11-29 DIAGNOSIS — M4807 Spinal stenosis, lumbosacral region: Secondary | ICD-10-CM | POA: Diagnosis not present

## 2017-11-29 NOTE — Progress Notes (Signed)
Patient is following up after having an MRI of her lumbar spine.  She is having significant left-sided radicular symptoms that were not coming down.  She points to lateral aspect of her leg that feels not right to her in terms of numbness and tingling in just a odd sensation.  On exam she is had a positive straight leg raise on the left side but no significant weakness but certainly radicular symptoms as well.  She is had this in the past and he got so significantly bad that she had been to therapy and eventually got better but now this is flared up again.  MRI is reviewed with her and it does show degenerative disc disease at L4-L5 with a paracentral disc protrusion is more to the right we can certainly see how this can affect the nerves to the left side based on what you are saying.  Given her MRI findings she will work on back extension exercises and give this some time to get things to try to calm down as this is a contained paracentral disc protrusion.  Certainly the next step would be considering an epidural steroid injection if things do not calm down.  All questions concerns were answered and addressed.  Follow-up will otherwise be as needed.

## 2017-12-20 DIAGNOSIS — Z9884 Bariatric surgery status: Secondary | ICD-10-CM | POA: Diagnosis not present

## 2018-01-01 ENCOUNTER — Encounter: Payer: BLUE CROSS/BLUE SHIELD | Attending: General Surgery | Admitting: Skilled Nursing Facility1

## 2018-01-01 DIAGNOSIS — Z713 Dietary counseling and surveillance: Secondary | ICD-10-CM | POA: Insufficient documentation

## 2018-01-01 DIAGNOSIS — E669 Obesity, unspecified: Secondary | ICD-10-CM

## 2018-01-03 ENCOUNTER — Encounter: Payer: Self-pay | Admitting: Skilled Nursing Facility1

## 2018-01-03 NOTE — Progress Notes (Signed)
Follow-up visit:  Post-Operative sleeve Surgery  Medical Nutrition Therapy:  Appt start time: 6:00pm end time:  7:00pm  Primary concerns today: Post-operative Bariatric Surgery Nutrition Management 6 Month Post-Op Class  Surgery date: 05/15/2017 Surgery type: Sleeve gastrectomy Start weight at [facility]: 244.1 Weight today: 160.8  TANITA  BODY COMP RESULTS  01/01/2018   BMI (kg/m^2) 26.8   Fat Mass (lbs) 56.4   Fat Free Mass (lbs) 104.4   Total Body Water (lbs) 73.6    Information Reviewed/ Discussed During Appointment: -Review of composition scale numbers -Fluid requirements (64-100 ounces) -Protein requirements (60-80g) -Strategies for tolerating diet -Advancement of diet to include Starchy vegetables -Barriers to inclusion of new foods -Inclusion of appropriate multivitamin and calcium supplements  -Exercise recommendations   Fluid intake: adequate Medications: see list Supplementation: multi and calcium  Using straws: no Drinking while eating: no Having you been chewing well:no Chewing/swallowing difficulties: no Changes in vision: no Changes to mood/headaches: no Hair loss/Cahnges to skin/Changes to nails: no Any difficulty focusing or concentrating: no Sweating: no Dizziness/Lightheaded:  Palpitations: no  Carbonated beverages: no N/V/D/C/GAS: no Abdominal Pain: no Dumping syndrome: no  Recent physical activity:  adquate  Progress Towards Goal(s):  In progress.  Handouts given during visit include:  Phase V diet Progression   Goals Sheet  The Benefits of Exercise are endless.....  Support Group Topics  Pt Chosen Goals:  64 fluid ounces by 02/23/2018 Cardio 30 minutes 3 times a week by 02/23/2018  Teaching Method Utilized:  Visual Auditory Hands on   Demonstrated degree of understanding via:  Teach Back   Monitoring/Evaluation:  Dietary intake, exercise, and body weight. Follow up in 3 months for 9 month post-op visit.

## 2018-01-09 ENCOUNTER — Ambulatory Visit: Payer: BLUE CROSS/BLUE SHIELD | Admitting: Skilled Nursing Facility1

## 2018-01-14 DIAGNOSIS — M545 Low back pain: Secondary | ICD-10-CM | POA: Diagnosis not present

## 2018-01-14 DIAGNOSIS — R3129 Other microscopic hematuria: Secondary | ICD-10-CM | POA: Diagnosis not present

## 2018-02-13 NOTE — Patient Instructions (Signed)

## 2018-02-14 ENCOUNTER — Encounter: Payer: Self-pay | Admitting: Podiatry

## 2018-02-14 ENCOUNTER — Ambulatory Visit (INDEPENDENT_AMBULATORY_CARE_PROVIDER_SITE_OTHER): Payer: BLUE CROSS/BLUE SHIELD | Admitting: Podiatry

## 2018-02-14 VITALS — BP 107/66 | HR 89

## 2018-02-14 DIAGNOSIS — B070 Plantar wart: Secondary | ICD-10-CM

## 2018-02-14 DIAGNOSIS — L600 Ingrowing nail: Secondary | ICD-10-CM

## 2018-02-14 NOTE — Progress Notes (Signed)
Subjective:   Patient ID: Tracy Holt, female   DOB: 42 y.o.   MRN: [id number]   HPI Patient presents with a chronic ingrown toenail of the right big toe and on the right plantar heel there is a painful spot that patient states is hard to walk on and is been present recently.  The ingrown toenail been present for a long time and patient does not smoke and likes to be active   Review of Systems  All other systems reviewed and are negative.       Objective:  Physical Exam  Constitutional: She appears well-developed and well-nourished.  Cardiovascular: Intact distal pulses.  Pulmonary/Chest: Effort normal.  Musculoskeletal: Normal range of motion.  Neurological: She is alert.  Skin: Skin is warm.  Nursing note and vitals reviewed.   Neurovascular status intact muscle strength is adequate range of motion was within normal limits with incurvated right hallux medial border that is painful when pressed and make shoe gear difficult.  Also has a lesion plantar aspect right heel that upon debridement shows pinpoint bleeding pain to lateral pressure measures approximately 7 x 7 mm.  Patient is noted to have good digital perfusion well oriented x3     Assessment:  Chronic ingrown toenail deformity right hallux and probable verruca plantaris versus porokeratosis plantar aspect right     Plan:  H&P condition reviewed and I recommended correction of the right ingrown toenail explaining procedure and risk.  Patient wants surgery and today I infiltrated the right hallux 60 mill grams like Marcaine mixture sterile prep applied and using sterile instrumentation I remove the medial border exposed matrix and applied phenol 3 applications 30 seconds followed by alcohol lavage and sterile dressing.  Gave instructions on soaks for this and encouraged to call with questions and for the plantar right I debrided the lesion fully and applied chemical agent to create immune response and explained what to do if  blisters were to occur.  Reappoint for recheck

## 2018-02-20 ENCOUNTER — Ambulatory Visit (INDEPENDENT_AMBULATORY_CARE_PROVIDER_SITE_OTHER): Payer: BLUE CROSS/BLUE SHIELD | Admitting: Family Medicine

## 2018-02-20 ENCOUNTER — Encounter: Payer: Self-pay | Admitting: Family Medicine

## 2018-02-20 VITALS — BP 110/70 | HR 79 | Ht 67.0 in | Wt 155.8 lb

## 2018-02-20 DIAGNOSIS — R454 Irritability and anger: Secondary | ICD-10-CM | POA: Diagnosis not present

## 2018-02-20 DIAGNOSIS — H9312 Tinnitus, left ear: Secondary | ICD-10-CM

## 2018-02-20 MED ORDER — CITALOPRAM HYDROBROMIDE 20 MG PO TABS: 20.0000 mg | ORAL_TABLET | Freq: Every day | ORAL | 1 refills | Status: DC

## 2018-02-20 NOTE — Progress Notes (Signed)
   Subjective:    Patient ID: Tracy Holt, female    DOB: 1976/06/03, 42 y.o.   MRN: [id number]  HPI Chief Complaint  Patient presents with  . multiple issues    irriatable all the time for the last month.     She is here with complaints of feeling irritable. States she often feels angry. Denies feeling depressed. States she was on medication several years ago and recalls it being Celexa. She stopped it for no particular reason.  States she thought she was angry due to being obese so she has made significant changes including gastric sleeve surgery and is now in normal BMI range.  States she still feels irritable and angry.  Does not want counseling.  States she does not like to talk about her feelings.  Requests medication. No SI or HI.  She has an ongoing issue with left ear tinnitus that she describes as a whooshing sound.  Non-positional.  States it is episodic and pulsatile.  Denies hearing changes, headache, dizziness. No URI symptoms.  Denies any changes and has been worked up by cardiology for this.  She had a carotid ultrasound in 2018 that was negative.  States she plans on seeing ENT for this.  She is no longer on blood pressure medication since significant weight loss.  States she stopped this on her own.  Reviewed allergies, medications, past medical, surgical, family, and social history.    Review of Systems Pertinent positives and negatives in the history of present illness.     Objective:   Physical Exam BP 110/70   Pulse 79   Ht 5' 7"  (1.702 m)   Wt 155 lb 12.8 oz (70.7 kg)   BMI 24.40 kg/m   Alert and in no distress. No temporal artery fullness.  No TMJ tenderness, popping or pain. Tympanic membranes and canals are normal.  Gross hearing intact.  Pharyngeal area is normal. Neck is supple without adenopathy or thyromegaly. Cardiac exam shows a regular sinus rhythm without murmurs or gallops. Lungs are clear to auscultation.      Assessment & Plan:    Irritability - Plan: citalopram (CELEXA) 20 MG tablet  Tinnitus of left ear  History of irritability related to weight and now that she has lost a significant amount of weight post gastric sleeve she is still having issues with mood and requests medication. Start her on Celexa since she recalls doing well on this in the past.  Adamant that she does not want counseling. No harm to herself or others.  Discussed potential side effects and if she is feeling more irritable in the first week she will call and alprazolam can be sent in for her. Follow up in 2 weeks.   Ongoing history of tinnitus and vascular disease ruled out with carotid US in 2018. Exam unremarkable. She will call and schedule with ENT. Declines hearing test today.  Reports having labs at [facility] surgery within the past 2 months.  I will attempt to get these results.

## 2018-02-20 NOTE — Patient Instructions (Addendum)
Take 1/2 tablet (19m) for the first week in the evening and if you are doing well, increase to the full tablet (20 mg) starting week 2.  Follow up in 2 weeks

## 2018-03-07 ENCOUNTER — Encounter: Payer: Self-pay | Admitting: Family Medicine

## 2018-03-07 ENCOUNTER — Ambulatory Visit (INDEPENDENT_AMBULATORY_CARE_PROVIDER_SITE_OTHER): Payer: BLUE CROSS/BLUE SHIELD | Admitting: Family Medicine

## 2018-03-07 ENCOUNTER — Ambulatory Visit: Payer: BLUE CROSS/BLUE SHIELD | Admitting: Family Medicine

## 2018-03-07 VITALS — BP 120/78 | HR 55 | Wt 155.2 lb

## 2018-03-07 DIAGNOSIS — H9312 Tinnitus, left ear: Secondary | ICD-10-CM

## 2018-03-07 DIAGNOSIS — R454 Irritability and anger: Secondary | ICD-10-CM

## 2018-03-07 NOTE — Progress Notes (Signed)
   Subjective:    Patient ID: Tracy Holt, female    DOB: [birth year], 42 y.o.   MRN: [id number]  HPI Chief Complaint  Patient presents with  . 2 week follow-up    2 week follow-up. doing well on med. taking 1/2 tablets   She is here for a 2-week follow-up on irritability and starting Celexa.  States she has only been taking half a dose daily but has noticed improvement in symptoms.  Initially she had some mild nausea but this has resolved.  No concerns with sleep or appetite.  She declines counseling. 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  States there are counselors on site and she is managing okay.  Tinnitus in her left ear-she is scheduled to see ENT.  No new symptoms.  States she has an appointment in October with her OB/GYN   Reports regular visits and recent labs at [facility] surgery- Dr. Excell Seltzer.   Denies fever, chills, headache, dizziness, chest pain, palpitations, shortness of breath, abdominal pain, nausea, vomiting, diarrhea.  Review of Systems Pertinent positives and negatives in the history of present illness.     Objective:   Physical Exam BP 120/78   Pulse (!) 55   Wt 155 lb 3.2 oz (70.4 kg)   BMI 24.31 kg/m   Alert and oriented and in no acute distress. Not otherwise examined.       Assessment & Plan:  Irritability  Tinnitus of left ear  Reports only taking half a tablet of Celexa but is noticing some improvement.  No side effects and she does plan to stay on this medication.  Discussed gradually increasing to full tablet at her discretion. Encouraged her to get involved in counseling She will see ENT for tinnitus that has been ongoing and negative vascular origin determined.  Will be happy to assist in getting records to her ENT. I will attempt to get notes and lab results from [facility] surgery

## 2018-03-11 ENCOUNTER — Telehealth: Payer: Self-pay | Admitting: Family Medicine

## 2018-03-11 NOTE — Telephone Encounter (Signed)
Received requested records from [facility]. Sending back for review.

## 2018-03-15 DIAGNOSIS — H9312 Tinnitus, left ear: Secondary | ICD-10-CM | POA: Diagnosis not present

## 2018-03-15 DIAGNOSIS — H9313 Tinnitus, bilateral: Secondary | ICD-10-CM | POA: Diagnosis not present

## 2018-03-26 DIAGNOSIS — Z0289 Encounter for other administrative examinations: Secondary | ICD-10-CM

## 2018-03-29 ENCOUNTER — Other Ambulatory Visit (HOSPITAL_COMMUNITY)
Admission: RE | Admit: 2018-03-29 | Discharge: 2018-03-29 | Disposition: A | Discharge status: Home / Self Care | Payer: BLUE CROSS/BLUE SHIELD | Source: Ambulatory Visit | Attending: Obstetrics and Gynecology | Admitting: Obstetrics and Gynecology

## 2018-03-29 ENCOUNTER — Encounter: Payer: Self-pay | Admitting: Family Medicine

## 2018-03-29 ENCOUNTER — Other Ambulatory Visit: Payer: Self-pay | Admitting: Obstetrics and Gynecology

## 2018-03-29 DIAGNOSIS — Z1231 Encounter for screening mammogram for malignant neoplasm of breast: Secondary | ICD-10-CM

## 2018-03-29 DIAGNOSIS — Z01419 Encounter for gynecological examination (general) (routine) without abnormal findings: Secondary | ICD-10-CM | POA: Diagnosis not present

## 2018-03-29 DIAGNOSIS — Z01411 Encounter for gynecological examination (general) (routine) with abnormal findings: Secondary | ICD-10-CM | POA: Diagnosis not present

## 2018-04-02 LAB — CYTOLOGY - PAP
Diagnosis: NEGATIVE
HPV (WINDOPATH): NOT DETECTED

## 2018-04-03 ENCOUNTER — Encounter: Payer: Self-pay | Admitting: Skilled Nursing Facility1

## 2018-04-03 ENCOUNTER — Ambulatory Visit: Payer: BLUE CROSS/BLUE SHIELD | Admitting: Skilled Nursing Facility1

## 2018-04-03 ENCOUNTER — Encounter: Payer: BLUE CROSS/BLUE SHIELD | Attending: General Surgery | Admitting: Skilled Nursing Facility1

## 2018-04-03 DIAGNOSIS — Z713 Dietary counseling and surveillance: Secondary | ICD-10-CM | POA: Insufficient documentation

## 2018-04-03 DIAGNOSIS — E669 Obesity, unspecified: Secondary | ICD-10-CM

## 2018-04-03 NOTE — Progress Notes (Signed)
Follow-up visit:  Post-Operative sleeve Surgery  Medical Nutrition Therapy:  Appt start time: 6:00pm end time:  7:00pm  Primary concerns today: Post-operative Bariatric Surgery Nutrition Management 6 Month Post-Op Class  Pt states she is hitting 60 fluid ounces. Pt states she is now drinking while she works out which has helped her reach her goal. Pt states she is working out 5 days a week cardio and ran a 5k and got 3rd place. Pt states she does do weight resistance. Pt states she is not happy with her weight and wants to lose more weight.   24 hr recall: Protein muffin in the microwave and coffee or coffee with protein bar or oatmeal Peanut butter or atkins bar  Canned soup or pintos and cheese or wendys chil with potato  Peanut butter balls or just peanut butter or quest chips or yogurt with crunchy stuff Lettuce wraps or chicken or burger with vegetable  Pudding or yogurt   Fluid: 60 ounces dragon fruit packet in water or lemonade or gape    Surgery date: 05/15/2017 Surgery type: Sleeve gastrectomy Start weight at [facility]: 244.1 Weight today: 160.8  151.8  TANITA  BODY COMP RESULTS  01/01/2018 04/03/2018   BMI (kg/m^2) 26.8 25.3   Fat Mass (lbs) 56.4 50.8   Fat Free Mass (lbs) 104.4 101   Total Body Water (lbs)          73.6           71    Fluid intake: adequate Medications: see list Supplementation: multi procare capsule and calcium  Using straws: no Drinking while eating: no Having you been chewing well: yes Chewing/swallowing difficulties: no Changes in vision: no Changes to mood/headaches: no Hair loss/Cahnges to skin/Changes to nails: no Any difficulty focusing or concentrating: no Sweating: no Dizziness/Lightheaded: no Palpitations: no  Carbonated beverages: no N/V/D/C/GAS: no Abdominal Pain: no Dumping syndrome: no  Recent physical activity:  adquate  Progress Towards Goal(s):  In progress.   Teaching Method Utilized:  Visual Auditory Hands  on   Demonstrated degree of understanding via:  Teach Back   Monitoring/Evaluation:  Dietary intake, exercise, and body weight.

## 2018-04-04 ENCOUNTER — Encounter: Payer: Self-pay | Admitting: Family Medicine

## 2018-04-09 ENCOUNTER — Encounter: Payer: Self-pay | Admitting: Gastroenterology

## 2018-04-24 ENCOUNTER — Other Ambulatory Visit (INDEPENDENT_AMBULATORY_CARE_PROVIDER_SITE_OTHER): Payer: BLUE CROSS/BLUE SHIELD

## 2018-04-24 ENCOUNTER — Telehealth: Payer: Self-pay | Admitting: Gastroenterology

## 2018-04-24 ENCOUNTER — Encounter: Payer: Self-pay | Admitting: Gastroenterology

## 2018-04-24 ENCOUNTER — Ambulatory Visit (INDEPENDENT_AMBULATORY_CARE_PROVIDER_SITE_OTHER): Payer: BLUE CROSS/BLUE SHIELD | Admitting: Gastroenterology

## 2018-04-24 VITALS — BP 110/68 | HR 70 | Ht 65.0 in | Wt 154.1 lb

## 2018-04-24 DIAGNOSIS — K921 Melena: Secondary | ICD-10-CM

## 2018-04-24 DIAGNOSIS — Z9225 Personal history of immunosupression therapy: Secondary | ICD-10-CM | POA: Diagnosis not present

## 2018-04-24 DIAGNOSIS — K51919 Ulcerative colitis, unspecified with unspecified complications: Secondary | ICD-10-CM

## 2018-04-24 DIAGNOSIS — K5190 Ulcerative colitis, unspecified, without complications: Secondary | ICD-10-CM | POA: Diagnosis not present

## 2018-04-24 LAB — COMPREHENSIVE METABOLIC PANEL
ALT: 19 U/L (ref 0–35)
AST: 17 U/L (ref 0–37)
Albumin: 3.9 g/dL (ref 3.5–5.2)
Alkaline Phosphatase: 57 U/L (ref 39–117)
BUN: 17 mg/dL (ref 6–23)
CO2: 30 meq/L (ref 19–32)
Calcium: 9 mg/dL (ref 8.4–10.5)
Chloride: 104 mEq/L (ref 96–112)
Creatinine, Ser: 0.67 mg/dL (ref 0.40–1.20)
GFR: 102.74 mL/min (ref >60.00)
GLUCOSE: 85 mg/dL (ref 70–99)
POTASSIUM: 4.6 meq/L (ref 3.5–5.1)
Sodium: 139 mEq/L (ref 135–145)
Total Bilirubin: 0.6 mg/dL (ref 0.2–1.2)
Total Protein: 6.5 g/dL (ref 6.0–8.3)

## 2018-04-24 LAB — CBC WITH DIFFERENTIAL/PLATELET
BASOS PCT: 0.4 % (ref 0.0–3.0)
Basophils Absolute: 0 10*3/uL (ref 0.0–0.1)
EOS PCT: 2.1 % (ref 0.0–5.0)
Eosinophils Absolute: 0.2 10*3/uL (ref 0.0–0.7)
HCT: 40.4 % (ref 36.0–46.0)
Hemoglobin: 13.6 g/dL (ref 12.0–15.0)
Lymphocytes Relative: 17 % (ref 12.0–46.0)
Lymphs Abs: 1.4 10*3/uL (ref 0.7–4.0)
MCHC: 33.6 g/dL (ref 30.0–36.0)
MCV: 91 fl (ref 78.0–100.0)
MONOS PCT: 7.7 % (ref 3.0–12.0)
Monocytes Absolute: 0.6 10*3/uL (ref 0.1–1.0)
NEUTROS ABS: 6 10*3/uL (ref 1.4–7.7)
NEUTROS PCT: 72.8 % (ref 43.0–77.0)
PLATELETS: 223 10*3/uL (ref 150.0–400.0)
RBC: 4.43 Mil/uL (ref 3.87–5.11)
RDW: 13.5 % (ref 11.5–15.5)
WBC: 8.2 10*3/uL (ref 4.0–10.5)

## 2018-04-24 LAB — C-REACTIVE PROTEIN: CRP: 0.1 mg/dL — AB (ref 0.5–20.0)

## 2018-04-24 LAB — SEDIMENTATION RATE: SED RATE: 2 mm/h (ref 0–20)

## 2018-04-24 MED ORDER — SOD PICOSULFATE-MAG OX-CIT ACD 10-3.5-12 MG-GM -GM/160ML PO SOLN: 1.0000 | ORAL | 0 refills | Status: DC

## 2018-04-24 NOTE — Progress Notes (Signed)
Chief Complaint:  Ulcerative Colitis, hematochezia   Referring Provider:     Self    HPI:     Tracy Holt is a 42 y.o. female with a history of hypertension and obesity now s/p lap gastric sleeve in 04/2017 (BMI now 25), as well as a history of previously diagnosed ulcerative colitis presenting to the [facility] for evaluation of flare of UC symptoms.  She states she diagnosed with Ulcerative Colitis in 2010 in [place], [place], but has been in remission for the last 4 years without any IBD medications.  Index symptoms of hematochezia, urgency, diarrhea, fatigue.  She was diagnosed by colonoscopy and initially started on 5 ASA therapy then transition to Remicade, but stopped when infusions changed from clinic to hospital due to experience and high cost.  Was transitioned to Humira and remained in clinical remission but stopped due to immunosuppressive side effects.  In total, treated for approximately 2 years and has remained in clinical remission for the last 4 years.  Ulcerative colitis diagnosed 4 years ago and currently with flare of index symptoms for the last 4 weeks.  Was previously in remission x4 years. Symptoms described as urgency, bloody diarrhea, fatigue. Last colonoscopy was 2010 (colo x2 and flex sig x1 that year).   She states her symptoms started again approximately 4 weeks ago described as 10-15 bloody loose stools per day, urgency, fatigue, nocturnal stools, all similar to index symptoms.  No preceding exposure to antibiotics, hospitalization, sick contacts, travel but was witnessed to a homicide suicide in the parking lot of her work on September 6.  She was started on Celexa in September for irritability/anxiety, but otherwise no new medications and denies any OTC meds, supplements, herbals.  No EIMs.  No previous steroids to her knowledge. No previous EGD.  No known family history of CRC, GI malignancy, liver disease, pancreatic disease, or IBD.    Hx of vertical sleeve with appropriate weight loss over the last 11 months.  Tolerating p.o. intake per postoperative recommendations and otherwise without any UGI symptoms.  No recent labs or abdominal imaging for review today.   Past Medical History:  Diagnosis Date  . Anemia    hx of  . Anxiety   . Colonic polyp   . Costochondritis   . Elevated blood pressure (not hypertension)   . Essential hypertension 12/29/2015  . History of lump in breast   . Hypertension   . Sleep apnea    No machine  . UC (ulcerative colitis) ([place])   . UTI (urinary tract infection)      Past Surgical History:  Procedure Laterality Date  . arthroscopic  knee     left  . CESAREAN SECTION     x2  . COLONOSCOPY W/ POLYPECTOMY     x2  . FOOT SURGERY     right plantar facitis surgery  . LAPAROSCOPIC GASTRIC SLEEVE RESECTION N/A 05/15/2017   Procedure: LAPAROSCOPIC GASTRIC SLEEVE RESECTION;  Surgeon: Excell Seltzer, MD;  Location: [facility];  Service: General;  Laterality: N/A;  . TUBAL LIGATION     Family History  Problem Relation Age of Onset  . Thyroid disease Mother   . Diabetes Mother   . Hypertension Mother   . Diabetes Father   . Hyperlipidemia Father   . Hypertension Father   . Cancer Paternal Grandmother   . Heart failure Paternal Grandmother   . Heart disease Paternal Grandfather  Social History   Tobacco Use  . Smoking status: Former Smoker    Last attempt to quit: 05/30/2013    Years since quitting: 4.9  . Smokeless tobacco: Never Used  Substance Use Topics  . Alcohol use: No    Frequency: Never  . Drug use: No   Current Outpatient Medications  Medication Sig Dispense Refill  . citalopram (CELEXA) 20 MG tablet Take 1 tablet (20 mg total) by mouth daily. 30 tablet 1  . Multiple Vitamin (MULTIVITAMIN WITH MINERALS) TABS tablet Take 1 tablet by mouth daily.     No current facility-administered medications for this visit.    No Known Allergies   Review of  Systems: All systems reviewed and negative except where noted in HPI.     Physical Exam:    Wt Readings from Last 3 Encounters:  04/24/18 154 lb 2 oz (69.9 kg)  04/03/18 151 lb 12.8 oz (68.9 kg)  03/07/18 155 lb 3.2 oz (70.4 kg)    Ht _0  (1.651 m)   Wt 154 lb 2 oz (69.9 kg)   BMI 25.65 kg/m  Constitutional:  Pleasant, in no acute distress. Psychiatric: Normal mood and affect. Behavior is normal. EENT: Pupils normal.  Conjunctivae are normal. No scleral icterus. Neck supple. No cervical LAD. Cardiovascular: Normal rate, regular rhythm. No edema Pulmonary/chest: Effort normal and breath sounds normal. No wheezing, rales or rhonchi. Abdominal: Soft, nondistended, nontender. Bowel sounds active throughout. There are no masses palpable. No hepatomegaly. Neurological: Alert and oriented to person place and time. Skin: Skin is warm and dry. No rashes noted.   ASSESSMENT AND PLAN;   Tracy Holt is a 42 y.o. female presenting with:  1) Ulcerative Colitis: History of ulcerative colitis diagnosed in 2010 previously treated with 5-ASA, Remicade, Humira which she stopped due to immunosuppressive side effect profile, and has been in clinical remission for the last 4 years without any IBD medications, now presenting with flare of index symptoms.  Flare very likely stemming from recent stressful event (witnessed homicide suicide in the parking lot of her work), and will evaluate and treat as below:  - ESR, CRP, fecal lactoferrin -CBC -CMP - Stool studies to rule out infectious etiology - Expedited colonoscopy to evaluate extent and severity of disease - Given overall clinical stability we will hold off on implementing new medications until results of the above studies and colonoscopy.  However did briefly discuss Entyvio given the favorable side effect profile and the need for Biologics in the past, suggestive of at least moderately severe disease. - We will check QuantiFERON gold and  hepatitis panel in preparation for likely biologic therapy - RTC after colonoscopy - We will discuss health maintenance (i.e. immunizations, micronutrient evaluation, etc.) at follow-up appointment.  UTD on pap  2) Hematochezia: Likely secondary to the above ulcerative colitis, will certainly evaluate for alternate etiology at time of colonoscopy.  Will check CBC for evidence of anemia.  The indications, risks, and benefits of colonoscopy were explained to the patient in detail. Risks include but are not limited to bleeding, perforation, adverse reaction to medications, and cardiopulmonary compromise. Sequelae include but are not limited to the possibility of surgery, hospitalization, and mortality. The patient verbalized understanding and wished to proceed. All questions answered, referred to the scheduler and bowel prep ordered. Further recommendations pending results of the exam.     Lavena Bullion, DO, FACG  04/24/2018, 8:33 AM   Henson, Vickie L, NP-C

## 2018-04-24 NOTE — Telephone Encounter (Signed)
Pt called to inform than clenpiq needs PA, her procedure is tomorrow.

## 2018-04-24 NOTE — Patient Instructions (Signed)
If you are age 41 or older, your body mass index should be between 23-30. Your Body mass index is 25.65 kg/m. If this is out of the aforementioned range listed, please consider follow up with your Primary Care Provider.  If you are age 61 or younger, your body mass index should be between 19-25. Your Body mass index is 25.65 kg/m. If this is out of the aformentioned range listed, please consider follow up with your Primary Care Provider.   You have been scheduled for a colonoscopy. Please follow written instructions given to you at your visit today.  Please pick up your prep supplies at the pharmacy within the next 1-3 days. If you use inhalers (even only as needed), please bring them with you on the day of your procedure. Your physician has requested that you go to www.startemmi.com and enter the access code given to you at your visit today. This web site gives a general overview about your procedure. However, you should still follow specific instructions given to you by our office regarding your preparation for the procedure.  We have sent the following medications to your pharmacy for you to pick up at your convenience: Clenpiq  Please go to the lab on the 2nd floor [address] before you leave the office today.   It was a pleasure to see you today!  Vito Cirigliano, D.O.

## 2018-04-24 NOTE — Addendum Note (Signed)
Addended by: Harl Bowie on: 04/24/2018 10:12 AM   Modules accepted: Orders

## 2018-04-24 NOTE — Telephone Encounter (Signed)
Called pharmacy sorted out the issue. Pt is aware and medication is ready to be picked up.

## 2018-04-25 ENCOUNTER — Encounter: Payer: Self-pay | Admitting: Gastroenterology

## 2018-04-25 ENCOUNTER — Ambulatory Visit (AMBULATORY_SURGERY_CENTER): Payer: BLUE CROSS/BLUE SHIELD | Admitting: Gastroenterology

## 2018-04-25 VITALS — BP 135/72 | HR 49 | Temp 98.4°F | Resp 15 | Ht 65.0 in | Wt 154.0 lb

## 2018-04-25 DIAGNOSIS — K635 Polyp of colon: Secondary | ICD-10-CM | POA: Diagnosis not present

## 2018-04-25 DIAGNOSIS — K51819 Other ulcerative colitis with unspecified complications: Secondary | ICD-10-CM

## 2018-04-25 DIAGNOSIS — D125 Benign neoplasm of sigmoid colon: Secondary | ICD-10-CM | POA: Diagnosis not present

## 2018-04-25 DIAGNOSIS — K529 Noninfective gastroenteritis and colitis, unspecified: Secondary | ICD-10-CM | POA: Diagnosis not present

## 2018-04-25 DIAGNOSIS — K921 Melena: Secondary | ICD-10-CM | POA: Diagnosis not present

## 2018-04-25 MED ORDER — PREDNISONE 5 MG PO TABS: 5.0000 mg | ORAL_TABLET | Freq: Every day | ORAL | 0 refills | Status: DC

## 2018-04-25 MED ORDER — SODIUM CHLORIDE 0.9 % IV SOLN: 500.0000 mL | Freq: Once | INTRAVENOUS | Status: DC

## 2018-04-25 NOTE — Progress Notes (Signed)
To PACU, VSS. Report to [email or web address] 

## 2018-04-25 NOTE — Progress Notes (Signed)
Called to room to assist during endoscopic procedure.  Patient ID and intended procedure confirmed with present staff. Received instructions for my participation in the procedure from the performing physician.  

## 2018-04-25 NOTE — Patient Instructions (Signed)
Handouts given on UC and polyps.    YOU HAD AN ENDOSCOPIC PROCEDURE TODAY AT [facility]:   Refer to the procedure report that was given to you for any specific questions about what was found during the examination.  If the procedure report does not answer your questions, please call your gastroenterologist to clarify.  If you requested that your care partner not be given the details of your procedure findings, then the procedure report has been included in a sealed envelope for you to review at your convenience later.  YOU SHOULD EXPECT: Some feelings of bloating in the abdomen. Passage of more gas than usual.  Walking can help get rid of the air that was put into your GI tract during the procedure and reduce the bloating. If you had a lower endoscopy (such as a colonoscopy or flexible sigmoidoscopy) you may notice spotting of blood in your stool or on the toilet paper. If you underwent a bowel prep for your procedure, you may not have a normal bowel movement for a few days.  Please Note:  You might notice some irritation and congestion in your nose or some drainage.  This is from the oxygen used during your procedure.  There is no need for concern and it should clear up in a day or so.  SYMPTOMS TO REPORT IMMEDIATELY:   Following lower endoscopy (colonoscopy or flexible sigmoidoscopy):  Excessive amounts of blood in the stool  Significant tenderness or worsening of abdominal pains  Swelling of the abdomen that is new, acute  Fever of 100F or higher   For urgent or emergent issues, a gastroenterologist can be reached at any hour by calling [phone].   DIET:  We do recommend a small meal at first, but then you may proceed to your regular diet.  Drink plenty of fluids but you should avoid alcoholic beverages for 24 hours.  ACTIVITY:  You should plan to take it easy for the rest of today and you should NOT DRIVE or use heavy machinery until tomorrow (because of the  sedation medicines used during the test).    FOLLOW UP: Our staff will call the number listed on your records the next business day following your procedure to check on you and address any questions or concerns that you may have regarding the information given to you following your procedure. If we do not reach you, we will leave a message.  However, if you are feeling well and you are not experiencing any problems, there is no need to return our call.  We will assume that you have returned to your regular daily activities without incident.  If any biopsies were taken you will be contacted by phone or by letter within the next 1-3 weeks.  Please call us at [phone] if you have not heard about the biopsies in 3 weeks.    SIGNATURES/CONFIDENTIALITY: You and/or your care partner have signed paperwork which will be entered into your electronic medical record.  These signatures attest to the fact that that the information above on your After Visit Summary has been reviewed and is understood.  Full responsibility of the confidentiality of this discharge information lies with you and/or your care-partner.

## 2018-04-25 NOTE — Op Note (Signed)
[place] Patient Name: Tracy Holt Procedure Date: 04/25/2018 10:10 AM MRN: [id number] Endoscopist: Gerrit Heck , MD Age: 42 Referring MD:  Date of Birth: 02/01/1976 Gender: Female Account #: [id number] Procedure:                Colonoscopy Indications:              Hematochezia, Change in bowel habits, Diarrhea                            (presumed secondary to ulcerative colitis)                           History of Ulcerative Colitis diagnosed in 2010,                            which had been in clinical remission for 4+ years                            with recent recurrence of index symptoms. Not                            currently on any IBD therapy. Medicines:                Monitored Anesthesia Care Procedure:                Pre-Anesthesia Assessment:                           - Prior to the procedure, a History and Physical                            was performed, and patient medications and                            allergies were reviewed. The patient's tolerance of                            previous anesthesia was also reviewed. The risks                            and benefits of the procedure and the sedation                            options and risks were discussed with the patient.                            All questions were answered, and informed consent                            was obtained. Prior Anticoagulants: The patient has                            taken no previous anticoagulant or antiplatelet  agents. ASA Grade Assessment: II - A patient with                            mild systemic disease. After reviewing the risks                            and benefits, the patient was deemed in                            satisfactory condition to undergo the procedure.                           After obtaining informed consent, the colonoscope                            was passed under direct vision. Throughout  the                            procedure, the patient's blood pressure, pulse, and                            oxygen saturations were monitored continuously. The                            Colonoscope was introduced through the anus and                            advanced to the the terminal ileum. The colonoscopy                            was performed without difficulty. The patient                            tolerated the procedure well. The quality of the                            bowel preparation was adequate. Scope In: 10:23:28 AM Scope Out: 10:48:37 AM Scope Withdrawal Time: 0 hours 19 minutes 33 seconds  Total Procedure Duration: 0 hours 25 minutes 9 seconds  Findings:                 The perianal and digital rectal examinations were                            normal.                           Two sessile polyps were found in the sigmoid colon.                            The polyps were 3 to 6 mm in size. These polyps                            were removed with a cold snare. Resection and  retrieval were complete. Estimated blood loss was                            minimal.                           Inflammation characterized by altered vascularity,                            congestion (edema), erosions, erythema and                            serpentine ulcerations was found in a continuous                            and circumferential pattern from the anus to the                            distal sigmoid colon, with transition to normal                            mucosa at 25 cm from the anal verge. The proximal                            sigmoid colon, the descending colon and the                            transverse colon were spared. There was similar                            inflammatory changes again noted in the cecum and                            proximal ascending colon and a single small patch                            of erythema  and aphthae in the transverse colon.                            This was graded as Mayo Score 2 (moderate, with                            marked erythema, absent vascular pattern,                            friability, erosions). Biopsies were taken with a                            cold forceps for histology. Estimated blood loss                            was minimal.  The terminal ileum appeared normal.                           Retroflexion in the rectum was not performed due to                            anatomy and significant inflammatory changes. Complications:            No immediate complications. Estimated Blood Loss:     Estimated blood loss was minimal. Impression:               - Two 3 to 6 mm polyps in the sigmoid colon,                            removed with a cold snare. Resected and retrieved.                           - Moderately severe colitis in the rectum,                            rectosigmoid colon and distal sigmoid colon, as                            well as in the cecum and proximal ascending colon.                            This was graded as Mayo Score 2 (moderate disease).                            Several mucosal biopsies were taken throughout the                            colon, including from normal appearing mucosa in                            the transverse through sigmoid colon.                           - The examined portion of the ileum was normal. Recommendation:           - Patient has a contact number available for                            emergencies. The signs and symptoms of potential                            delayed complications were discussed with the                            patient. Return to normal activities tomorrow.                            Written discharge instructions were provided to the  patient.                           - Resume previous diet today.                            - Continue present medications.                           - Await pathology results.                           - Return to GI clinic in 1-2 weeks. Will discuss                            implementing long term medical management (ie,                            biologic therapy, immunomodulators, etc) at that                            appointment pending labs that have already been                            collected (ie, Hep B screening, Quant Gold, etc).                           - Start Prednisone 40 mg PO daily x2 weeks, then                            wean as follow:                           - 30 mg x1 week.                           - 20 mg x1 week.                           - 15 mg x1 week.                           - 10 mg x1 week.                           - 5 mg x1 week then stop. Gerrit Heck, MD 04/25/2018 11:04:08 AM

## 2018-04-26 ENCOUNTER — Encounter: Payer: Self-pay | Admitting: Family Medicine

## 2018-04-26 ENCOUNTER — Telehealth: Payer: Self-pay

## 2018-04-26 ENCOUNTER — Other Ambulatory Visit: Payer: Self-pay | Admitting: Family Medicine

## 2018-04-26 DIAGNOSIS — R454 Irritability and anger: Secondary | ICD-10-CM

## 2018-04-26 LAB — QUANTIFERON-TB GOLD PLUS
NIL: 0.02 [IU]/mL
QUANTIFERON-TB GOLD PLUS: NEGATIVE
TB1-NIL: 0 IU/mL
TB2-NIL: 0 IU/mL

## 2018-04-26 LAB — HEPATITIS B SURFACE ANTIGEN: HEP B S AG: NONREACTIVE

## 2018-04-26 LAB — HEPATITIS B SURFACE ANTIBODY,QUALITATIVE: Hep B S Ab: NONREACTIVE

## 2018-04-26 NOTE — Telephone Encounter (Signed)
  Follow up Call-  Call back number 04/25/2018  Post procedure Call Back phone  # [phone]  Permission to leave phone message Yes  Some recent data might be hidden     Patient questions:  Do you have a fever, pain , or abdominal swelling? No. Pain Score  0 *  Have you tolerated food without any problems? Yes.    Have you been able to return to your normal activities? Yes.    Do you have any questions about your discharge instructions: Diet   No. Medications  No. Follow up visit  No.  Do you have questions or concerns about your Care? No.  Actions: * If pain score is 4 or above: No action needed, pain <4.

## 2018-04-29 ENCOUNTER — Ambulatory Visit (INDEPENDENT_AMBULATORY_CARE_PROVIDER_SITE_OTHER): Payer: BLUE CROSS/BLUE SHIELD | Admitting: Family Medicine

## 2018-04-29 ENCOUNTER — Encounter: Payer: Self-pay | Admitting: Family Medicine

## 2018-04-29 VITALS — BP 120/78 | HR 76 | Temp 97.9°F | Resp 16 | Wt 154.4 lb

## 2018-04-29 DIAGNOSIS — R0982 Postnasal drip: Secondary | ICD-10-CM

## 2018-04-29 DIAGNOSIS — J069 Acute upper respiratory infection, unspecified: Secondary | ICD-10-CM | POA: Diagnosis not present

## 2018-04-29 MED ORDER — CHLORPHEN-PE-ACETAMINOPHEN 4-10-325 MG PO TABS: 1.0000 | ORAL_TABLET | ORAL | 0 refills | Status: DC | PRN

## 2018-04-29 NOTE — Patient Instructions (Signed)
Take the Norel AD samples as discussed. 1 tablet every 4-6 hours with a full glass of water.   Use salt water gargles and stay well hydrated.   Call or message if you are significantly worse or if not improving in the next 2-3 days.

## 2018-04-29 NOTE — Progress Notes (Signed)
Chief Complaint  Patient presents with  . sick    sick 2 days. symptoms, sinus pressure, cough,sore throat, drainage, headache, family has been sick    Subjective:  Akylah Hascall is a 42 y.o. female who presents for a 3 day history of rhinorrhea, nasal congestion, sinus pressure, sore throat, post nasal drainage, and dry cough.  Recent flare up of UC and is being treated with steroids currently.   Denies fever, chills, headache, dizziness, ear pain, chest pain, palpitations, shortness of breath, wheezing, abdominal pain, N/V/D.   Treatment to date: on steroids for UC, Tylenol.  + sick contacts.  No other aggravating or relieving factors.  No other c/o.  ROS as in subjective.   Objective: Vitals:   04/29/18 1352  BP: 120/78  Pulse: 76  Resp: 16  Temp: 97.9 F (36.6 C)  SpO2: 98%    General appearance: Alert, WD/WN, no distress, mildly ill appearing                             Skin: warm, no rash                           Head: no sinus tenderness                            Eyes: conjunctiva normal, corneas clear, PERRLA                            Ears: pearly TMs, external ear canals normal                          Nose: septum midline, turbinates swollen, with erythema and clear discharge             Mouth/throat: MMM, tongue normal, mild pharyngeal erythema with cobblestoning, no edema or exudate                            Neck: supple, no adenopathy, no thyromegaly, nontender                          Heart: RRR, normal S1, S2, no murmurs                         Lungs: CTA bilaterally, no wheezes, rales, or rhonchi      Assessment: Acute URI  Post-nasal drainage   Plan: Discussed diagnosis and treatment of URI. Discussed holding off on antibiotic and treating this as a viral URI for now.  Suggested symptomatic OTC remedies. Norel AD samples given #10.  Nasal saline spray for congestion.   Call/return in 2-3 days if symptoms aren't resolving or sooner if symptoms  worsen.

## 2018-05-01 LAB — GASTROINTESTINAL PATHOGEN PANEL PCR
C. difficile Tox A/B, PCR: NOT DETECTED
Campylobacter, PCR: NOT DETECTED
Cryptosporidium, PCR: NOT DETECTED
E COLI (ETEC) LT/ST, PCR: NOT DETECTED
E COLI (STEC) STX1/STX2, PCR: NOT DETECTED
E COLI 0157, PCR: NOT DETECTED
Giardia lamblia, PCR: NOT DETECTED
NOROVIRUS, PCR: NOT DETECTED
Rotavirus A, PCR: NOT DETECTED
SHIGELLA, PCR: NOT DETECTED
Salmonella, PCR: NOT DETECTED

## 2018-05-01 LAB — FECAL LACTOFERRIN, QUANT
Fecal Lactoferrin: POSITIVE — AB
MICRO NUMBER:: 91306016
SPECIMEN QUALITY:: ADEQUATE

## 2018-05-02 ENCOUNTER — Ambulatory Visit
Admission: RE | Admit: 2018-05-02 | Discharge: 2018-05-02 | Disposition: A | Discharge status: Home / Self Care | Payer: BLUE CROSS/BLUE SHIELD | Source: Ambulatory Visit | Attending: Obstetrics and Gynecology | Admitting: Obstetrics and Gynecology

## 2018-05-02 DIAGNOSIS — Z1231 Encounter for screening mammogram for malignant neoplasm of breast: Secondary | ICD-10-CM | POA: Diagnosis not present

## 2018-05-07 ENCOUNTER — Ambulatory Visit (INDEPENDENT_AMBULATORY_CARE_PROVIDER_SITE_OTHER): Payer: BLUE CROSS/BLUE SHIELD | Admitting: Gastroenterology

## 2018-05-07 ENCOUNTER — Encounter: Payer: Self-pay | Admitting: Gastroenterology

## 2018-05-07 VITALS — BP 122/64 | HR 82 | Ht 65.0 in | Wt 153.0 lb

## 2018-05-07 DIAGNOSIS — Z23 Encounter for immunization: Secondary | ICD-10-CM | POA: Diagnosis not present

## 2018-05-07 DIAGNOSIS — K51919 Ulcerative colitis, unspecified with unspecified complications: Secondary | ICD-10-CM | POA: Diagnosis not present

## 2018-05-07 NOTE — Patient Instructions (Addendum)
If you are age 41 or older, your body mass index should be between 23-30. Your Body mass index is 25.46 kg/m. If this is out of the aforementioned range listed, please consider follow up with your Primary Care Provider.  If you are age 20 or younger, your body mass index should be between 19-25. Your Body mass index is 25.46 kg/m. If this is out of the aformentioned range listed, please consider follow up with your Primary Care Provider.   I will be faxing your referral to [facility] to get you set up with your Entyvio Infusions.  Begin your Prednisone Taper 41m by mouth for 5 days 247mby mouth for 5 days 1083my mouth for 5 days 5mg43m mouth for 5 days Then Stop.  It was a pleasure to see you today!  Vito Cirigliano, D.O.

## 2018-05-07 NOTE — Progress Notes (Signed)
P  Chief Complaint:    Ulcerative colitis  GI History: 42 year old female with a history of hypertension and obesity now s/p lap gastric sleeve in 04/2017 (BMI now 25), and history of Ulcerative Colitis diagnosed in 2010 in [place], [place].  Index symptoms of hematochezia, urgency, diarrhea, fatigue and diagnosed by colonoscopy in 2010 and started on 5 ASA therapy then transition to Remicade, but stopped when infusions changed from clinic to hospital due to experience at high cost.  Was transitioned to Humira and remained in clinical remission for a few years, but stopped due to concern for immunosuppressive side effects.  In total, treated for approximately 2 years and had remained in clinical remission until September 2019.  Recurrence of index symptoms consistent with flare and initially seen by me on 04/24/2018 with subsequent colonoscopy on 04/25/2018 demonstrating moderately severe colitis in the rectum, rectosigmoid colon, distal sigmoid colon and cecum and proximal ascending colon, with normal terminal ileum.  Was started on prednisone 40 mg p.o. daily x2 weeks with plan to taper and start immunosuppressive therapy.  HPI:     Patient is a 42 y.o. female with a history of Ulcerative Colitis as noted above presenting to the Gastroenterology Clinic for follow-up.  She was initially seen by me on 04/24/2018 and colonoscopy completed on 04/25/2018 and notable for moderately severe, active colitis in the rectum, rectosigmoid colon along with moderately severe colitis in the cecum and proximal ascending colon, with normal terminal ileum.  Was started on prednisone 40 mg p.o. daily x2 weeks with plan to taper.   Today she states she is having med ADRs with Prednisone, to include aggressive behavior and increased PO intake. Diarrhea has resolvd, now with 1 formed stool/day, but still with blood on stool. Increased lower abdominal gas pain.   Recent labs notable for negative stool culture, negative for  TB, normal CBC, c-Met, ESR, and CRP.  Positive fecal lactoferrin.  Negative hep B surface antigen but also negative hep B surface antibody-was referred for hep B vaccination  IBD History:  Tracy Holt is a 42 y.o. female with a history of left-sided Ulcerative Colitis with large cecal patch.    Evaluation to date:  - TPMT: Not done, patient not planning on immunomodulators therapy - TB testing: Negative - HBV status: Hep B surface antigen negative, hep B surface antibody negative-referred for hep B vaccination - Pertinent Imaging: None recently - Last colonoscopy: 04/25/2018: Moderate to severely active, chronic colitis in the rectum, rectosigmoid colon along with large patch in the cecum and proximal ascending colon, with biopsies consistent with Ulcerative Colitis.  Benign hyperplastic polyps removed from sigmoid colon. - Small bowel imaging: None - History of EIMs: None  Medications to date: 5 ASA, Remicade, Humira.  Currently on prednisone 40 mg daily.  Health Maintenance:  - DEXA: We will schedule 6 months post steroids - Vaccinations:      - Annual Flu Vaccine -to obtain      - Pneumococcal Vaccine if receiving immunosuppression: -To obtain - Micronutrient eval:       - Annual Vit D, B6, iron panel: Will obtain at follow-up      - Restrictive diet/wt loss: B1, selenium, fat soluble vitamins, zinc, B12 - - Annual Pap (if immunosuppressed): Review at follow-up - Surveillance colonoscopy: Not due - Surveillance labs for immunomodulators: Not due - Annual depression screening: Complete - Annual Dermatology/Skin exam: Will refer at follow-up  Endoscopic Hx: - Colonoscopy x2 and flexible sigmoidoscopy x1 completed in  2010 in [place], [place].  Endoscopy reports not available for review, but per patient notable for at least moderate colitis - Colonoscopy (04/25/2018, Dr. Bryan Lemma): moderately severe colitis in the rectum, rectosigmoid colon, distal sigmoid colon and cecum and proximal  ascending colon, with normal terminal ileum.    Review of systems:     No chest pain, no SOB, no fevers, no urinary sx   Past Medical History:  Diagnosis Date  . Anemia    hx of  . Anxiety   . Colonic polyp   . Costochondritis   . Elevated blood pressure (not hypertension)   . Essential hypertension 12/29/2015  . History of lump in breast   . Hypertension   . Sleep apnea    No machine  . UC (ulcerative colitis) ([place])   . UTI (urinary tract infection)     Patient's surgical history, family medical history, social history, medications and allergies were all reviewed in Epic    Current Outpatient Medications  Medication Sig Dispense Refill  . citalopram (CELEXA) 20 MG tablet TAKE 1 TABLET BY MOUTH EVERY DAY 90 tablet 0  . Multiple Vitamin (MULTIVITAMIN WITH MINERALS) TABS tablet Take 1 tablet by mouth daily.    . predniSONE (STERAPRED UNI-PAK 21 TAB) 5 MG (21) TBPK tablet Take 5 mg by mouth daily.     No current facility-administered medications for this visit.     Physical Exam:     BP 122/64   Pulse 82   Ht _0  (1.651 m)   Wt 153 lb (69.4 kg)   BMI 25.46 kg/m   GENERAL:  Pleasant female in NAD PSYCH: : Cooperative, normal affect EENT:  conjunctiva pink, mucous membranes moist, neck supple without masses CARDIAC:  RRR, no murmur heard, no peripheral edema PULM: Normal respiratory effort, lungs CTA bilaterally, no wheezing ABDOMEN:  Nondistended, soft, nontender. No obvious masses, no hepatomegaly,  normal bowel sounds SKIN:  turgor, no lesions seen Musculoskeletal:  Normal muscle tone, normal strength NEURO: Alert and oriented x 3, no focal neurologic deficits   IMPRESSION and PLAN:    #1. Ulcerative Colitis: Left-sided UC with large cecal patch extending into the proximal ascending colon, with endoscopic and histologic moderate to severely active disease, requiring steroids and escalation to immunosuppressive therapy.  Had a long discussion regarding the  pathophysiology of UC along with medical management options and will proceed as below:  - Start [employer] - Start steroid taper: increase rate of taper as she will start Entyvio and d/t side effects - Colonoscopy 6 months after Entyvio start - DEXA 6 months after completing steroids - ESR/CRP not elevated with recent flare, but fecal lactoferrin wsa elevated- will use going forward if flare - Will review micronutrient evaluation at time of follow-up appointment along with ensure vaccinations up-to-date and other screenings as appropriate - RTC in 3 months or sooner as needed  I spent a total of 25 minutes of face-to-face time with the patient. Greater than 50% of the time was spent counseling and coordinating care.    Lavena Bullion ,DO, FACG 05/07/2018, 2:29 PM

## 2018-05-08 ENCOUNTER — Ambulatory Visit: Payer: BLUE CROSS/BLUE SHIELD | Admitting: Gastroenterology

## 2018-05-09 ENCOUNTER — Encounter: Payer: Self-pay | Admitting: Family Medicine

## 2018-05-16 ENCOUNTER — Telehealth: Payer: Self-pay

## 2018-05-16 DIAGNOSIS — K51919 Ulcerative colitis, unspecified with unspecified complications: Secondary | ICD-10-CM | POA: Diagnosis not present

## 2018-05-16 NOTE — Telephone Encounter (Signed)
Spoke to [facility] regarding recent referral for Entyvio Infusions. Patient had her first appointment today 05/16/18 at 8:00am. Next infusions are scheduled for 05/30/2018 and 06/27/2018.

## 2018-05-30 DIAGNOSIS — K51919 Ulcerative colitis, unspecified with unspecified complications: Secondary | ICD-10-CM | POA: Diagnosis not present

## 2018-06-11 ENCOUNTER — Ambulatory Visit (INDEPENDENT_AMBULATORY_CARE_PROVIDER_SITE_OTHER): Payer: BLUE CROSS/BLUE SHIELD | Admitting: Family Medicine

## 2018-06-11 ENCOUNTER — Encounter: Payer: BLUE CROSS/BLUE SHIELD | Attending: General Surgery | Admitting: Skilled Nursing Facility1

## 2018-06-11 VITALS — BP 120/80 | HR 70 | Wt 158.6 lb

## 2018-06-11 DIAGNOSIS — Z23 Encounter for immunization: Secondary | ICD-10-CM

## 2018-06-11 DIAGNOSIS — Z713 Dietary counseling and surveillance: Secondary | ICD-10-CM | POA: Insufficient documentation

## 2018-06-11 DIAGNOSIS — E669 Obesity, unspecified: Secondary | ICD-10-CM

## 2018-06-14 ENCOUNTER — Encounter: Payer: Self-pay | Admitting: Skilled Nursing Facility1

## 2018-06-14 NOTE — Progress Notes (Signed)
Bariatric Class:  Appt start time: 6:00 end time: 7:00  12 Month Post-Operative Nutrition Class  Patient was seen on 06/12/2018 for Post-Operative Nutrition [facility].   Surgery date: 05/15/2017 Surgery type: Sleeve gastrectomy Start weight at [facility]: pt declined Weight today:    The following the learning objectives were met by the patient during this course:  Review of TANITA scale information  Share and discuss bariatric surgery successes and non-scale victories  Identifies Phase VII (Maintenance Phase) Dietary Goals which will be lifelong  Identifies appropriate sources of fluids, proteins, non-starchy vegetables, and complex carbohydrates  Identifies well-balanced meals  Identifies portion control   Identifies appropriate multivitamin and calcium sources post-operatively  Describes the need for physical activity post-operatively and will follow MD recommendations  Identifies and describes SMART goals   Creates at least 2 SMART goals to begin immediately  States when to call healthcare provider regarding medication questions or post-operative complications  Handouts given during class include:  Phase VII: Maintenance Phase-Lifelong  Pt stated Goals: -I will chew until applesauce consistency by August 10, 2018 -I will drink 64 ounces of fluid fluid by August 10, 2018  Follow-Up Plan: Patient will follow-up at [facility] for on-going post-op nutrition visits.

## 2018-06-27 DIAGNOSIS — K51919 Ulcerative colitis, unspecified with unspecified complications: Secondary | ICD-10-CM | POA: Diagnosis not present

## 2018-07-03 DIAGNOSIS — Z9884 Bariatric surgery status: Secondary | ICD-10-CM | POA: Diagnosis not present

## 2018-07-17 ENCOUNTER — Encounter: Payer: Self-pay | Admitting: Family Medicine

## 2018-08-01 ENCOUNTER — Ambulatory Visit: Payer: BLUE CROSS/BLUE SHIELD | Admitting: Physician Assistant

## 2018-08-02 ENCOUNTER — Other Ambulatory Visit (INDEPENDENT_AMBULATORY_CARE_PROVIDER_SITE_OTHER): Payer: BLUE CROSS/BLUE SHIELD

## 2018-08-02 ENCOUNTER — Encounter: Payer: Self-pay | Admitting: Gastroenterology

## 2018-08-02 ENCOUNTER — Ambulatory Visit (INDEPENDENT_AMBULATORY_CARE_PROVIDER_SITE_OTHER): Payer: BLUE CROSS/BLUE SHIELD | Admitting: Gastroenterology

## 2018-08-02 VITALS — BP 116/72 | HR 70 | Ht 65.5 in | Wt 158.2 lb

## 2018-08-02 DIAGNOSIS — M25552 Pain in left hip: Secondary | ICD-10-CM | POA: Diagnosis not present

## 2018-08-02 DIAGNOSIS — M25551 Pain in right hip: Secondary | ICD-10-CM

## 2018-08-02 DIAGNOSIS — M25562 Pain in left knee: Secondary | ICD-10-CM

## 2018-08-02 DIAGNOSIS — K51919 Ulcerative colitis, unspecified with unspecified complications: Secondary | ICD-10-CM | POA: Diagnosis not present

## 2018-08-02 DIAGNOSIS — M25561 Pain in right knee: Secondary | ICD-10-CM

## 2018-08-02 LAB — B12 AND FOLATE PANEL
Folate: 20.9 ng/mL (ref >5.9)
Vitamin B-12: 446 pg/mL (ref 211–911)

## 2018-08-02 LAB — FERRITIN: Ferritin: 33.2 ng/mL (ref 10.0–291.0)

## 2018-08-02 LAB — IRON: Iron: 101 ug/dL (ref 42–145)

## 2018-08-02 LAB — VITAMIN D 25 HYDROXY (VIT D DEFICIENCY, FRACTURES): VITD: 42.72 ng/mL (ref 30.00–100.00)

## 2018-08-02 NOTE — Patient Instructions (Signed)
If you are age 42 or older, your body mass index should be between 23-30. Your Body mass index is 25.93 kg/m. If this is out of the aforementioned range listed, please consider follow up with your Primary Care Provider.  If you are age 26 or younger, your body mass index should be between 19-25. Your Body mass index is 25.93 kg/m. If this is out of the aformentioned range listed, please consider follow up with your Primary Care Provider.   Call us back at [phone] to schedule your Colonoscopy in April of 2020.   Please go to the lab on the 2nd floor [address] before you leave the office today.   Please call our office at [phone] to set up your 6 month follow up visit.  We have sent a referral to [facility]  [address], [place], [place] [address] Phone: [phone] Fax: [phone]  You will be receiving a call to schedule an appointment with them soon.  It was a pleasure to see you today!  Vito Cirigliano, D.O.

## 2018-08-02 NOTE — Progress Notes (Signed)
P  Chief Complaint:    Ulcerative colitis  GI History: 43 year old female with a history of hypertension and obesity now s/p lap gastric sleeve in 04/2017 (BMI now 25), and history of Ulcerative Colitis diagnosed in 2010 in [place], [place].  Index symptoms of hematochezia, urgency, diarrhea, fatigue and diagnosed by colonoscopy in 2010 and started on 5 ASA therapy then transition to Remicade, but stopped when infusions changed from clinic to hospital due to experience at high cost.  Was transitioned to Humira and remained in clinical remission for a few years, but stopped due to concern for immunosuppressive side effects.  In total, treated for approximately 2 years and had remained in clinical remission until September 2019.  Recurrence of index symptoms consistent with flare and initially seen by me on 04/24/2018 with subsequent colonoscopy on 04/25/2018 demonstrating moderately severe colitis in the rectum, rectosigmoid colon, distal sigmoid colon and cecum and proximal ascending colon, with normal terminal ileum.  Was started on prednisone 40 mg p.o. daily x2 weeks (tapered off rapidly due to steroid ADRs) and transitioned to [facility] in 04/2018 with good response to therapy.    HPI:     [name]. She states Entyvio working well and back to 1 formed stool/day without hematochezia.  Tolerating p.o. intake and without active GI symptoms.  However, increasing joint pains; hips, SI joints, and knees being worst.   IBD History:  Tracy Holt is a 43 y.o. female with a history of left-sided Ulcerative Colitis with large cecal patch.    Evaluation to date:  - TPMT: Not done, patient not planning on immunomodulators therapy - TB testing: Negative - HBV status: Hep B surface antigen negative, hep B surface antibody negative- has started hep B vaccination series - Pertinent Imaging: None recently - Last colonoscopy:  04/25/2018: Moderate to severely active, chronic colitis in the rectum, rectosigmoid colon along with large patch in the cecum and proximal ascending colon, with biopsies consistent with Ulcerative Colitis.  Benign hyperplastic polyps removed from sigmoid colon. - Small bowel imaging: None - History of EIMs: None previously, but possibly arthralgias.  Referring to Rheumatology  Medications to date: 5 ASA, Remicade, Humira, prednisone.  Currently on Entyvio monotherapy  Health Maintenance:  - DEXA: We will schedule 6 months post steroids - Vaccinations:      - Annual Flu Vaccine -2019      - Pneumococcal Vaccine if receiving immunosuppression: -To obtain - Micronutrient eval:       - Annual Vit D, B6, iron panel: Ordered today      - Restrictive diet/wt loss: B1, selenium, fat soluble vitamins, zinc, B12 -ordered today - Annual Pap (if immunosuppressed):  2019 - Surveillance colonoscopy: Not due - Surveillance labs for immunomodulators: Not due - Annual depression screening: Complete - Annual Dermatology/Skin exam:  Sent referral today  Endoscopic Hx: - Colonoscopy x2 and flexible sigmoidoscopy x1 completed in 2010 in [place], [place].  Endoscopy reports not available for review, but per patient notable for at least moderate colitis - Colonoscopy (04/25/2018, Dr. Bryan Lemma): moderately severe colitis in the rectum, rectosigmoid colon, distal sigmoid colon and cecum and proximal ascending colon, with normal terminal ileum.   Review of systems:     No chest pain, no SOB, no fevers, no urinary sx   Past Medical History:  Diagnosis Date  . Anemia    hx of  . Anxiety   . Colonic polyp   .  Costochondritis   . Elevated blood pressure (not hypertension)   . Essential hypertension 12/29/2015  . History of lump in breast   . Hypertension   . Sleep apnea    No machine  . UC (ulcerative colitis) ([place])   . UTI (urinary tract infection)     Patient's surgical history, family medical  history, social history, medications and allergies were all reviewed in Epic    Current Outpatient Medications  Medication Sig Dispense Refill  . Multiple Vitamin (MULTIVITAMIN WITH MINERALS) TABS tablet Take 1 tablet by mouth daily.     No current facility-administered medications for this visit.     Physical Exam:     Ht 5' 5.5" (1.664 m)   Wt 158 lb 4 oz (71.8 kg)   BMI 25.93 kg/m   GENERAL:  Pleasant female in NAD PSYCH: : Cooperative, normal affect EENT:  conjunctiva pink, mucous membranes moist, neck supple without masses CARDIAC:  RRR, no murmur heard, no peripheral edema PULM: Normal respiratory effort, lungs CTA bilaterally, no wheezing ABDOMEN:  Nondistended, soft, nontender. No obvious masses, no hepatomegaly,  normal bowel sounds SKIN:  turgor, no lesions seen Musculoskeletal:  Normal muscle tone, normal strength NEURO: Alert and oriented x 3, no focal neurologic deficits   IMPRESSION and PLAN:    #1. Ulcerative Colitis: Left-sided UC with large cecal patch extending into the proximal ascending colon, with endoscopic and histologic moderate to severely active disease, requiring steroids and escalation to immunosuppressive therapy.  Has since transitioned off steroids and on Entyvio monotherapy and appears to be in clinical remission.   Does endorse joint pains, to include SI joint, hips, knees all bilateral.  - Resume Entyvio - Colonoscopy 6 months after Entyvio start (10/2018) - DEXA 6 months after steroids (10/2018) - ESR/CRP not elevated with recent flare, but fecal lactoferrin wsa elevated- will use going forward if flare -Order micronutrient evaluation: B12, folate, iron, vitamin D, B6, selenium -Vaccinations up-to-date -Dermatology referral for annual screening -Rheumatology referral - RTC in 3 months or sooner as needed    #2.  Arthralgias: Bilateral hip, knee, SI joint pains.  Arthralgias noted and up to 12% of Entyvio patients.  Complaints seem to be  mostly axial, but also with knee pain, which can be independent of GI activity in IBD patients.  - Rheumatology referral to evaluate for medication effect, IBD related arthralgias, versus independent MSK pain     I spent a total of 25 minutes of face-to-face time with the patient. Greater than 50% of the time was spent counseling and coordinating care.   Tracy Holt ,DO, FACG 08/02/2018, 8:43 AM

## 2018-08-05 LAB — VITAMIN B6: VITAMIN B6: 49.2 ng/mL — AB (ref 2.1–21.7)

## 2018-08-05 LAB — SELENIUM SERUM: SELENIUM, BLOOD: 147 ug/L (ref 63–160)

## 2018-08-23 DIAGNOSIS — K51919 Ulcerative colitis, unspecified with unspecified complications: Secondary | ICD-10-CM | POA: Diagnosis not present

## 2018-09-17 DIAGNOSIS — K51919 Ulcerative colitis, unspecified with unspecified complications: Secondary | ICD-10-CM | POA: Diagnosis not present

## 2018-09-17 DIAGNOSIS — M2550 Pain in unspecified joint: Secondary | ICD-10-CM | POA: Diagnosis not present

## 2018-10-10 ENCOUNTER — Telehealth: Payer: Self-pay | Admitting: Gastroenterology

## 2018-10-10 NOTE — Telephone Encounter (Signed)
No reason for [employer] to deny this, as this is a very necessary lab for patients requiring biologic therapy.  Can use the following ICD 10 codes:  1) Ulcerative Colitis: K 51.90 2) Immunosuppressive therapy: Z 92.25  Please let me know if they have any issues with this.  Thank you.

## 2018-10-10 NOTE — Telephone Encounter (Signed)
Dr Bryan Lemma please advise on the test code needed

## 2018-10-17 NOTE — Telephone Encounter (Signed)
Called [facility] today to give new test codes  [id number]  Transferred to the billing department to give new test codes for labs drawn in April 24, 2018    Submitted new information to the billing department today    Spoke with Marylyn Ishihara in billing   Called patient to inform new info sent to [facility] they said it would take a 4-6 week turn around to see if insurance will accept

## 2018-10-23 DIAGNOSIS — K51919 Ulcerative colitis, unspecified with unspecified complications: Secondary | ICD-10-CM | POA: Diagnosis not present

## 2018-10-31 DIAGNOSIS — D2272 Melanocytic nevi of left lower limb, including hip: Secondary | ICD-10-CM | POA: Diagnosis not present

## 2018-10-31 DIAGNOSIS — D2271 Melanocytic nevi of right lower limb, including hip: Secondary | ICD-10-CM | POA: Diagnosis not present

## 2018-10-31 DIAGNOSIS — D239 Other benign neoplasm of skin, unspecified: Secondary | ICD-10-CM | POA: Diagnosis not present

## 2018-10-31 DIAGNOSIS — D229 Melanocytic nevi, unspecified: Secondary | ICD-10-CM | POA: Diagnosis not present

## 2018-12-18 DIAGNOSIS — K51919 Ulcerative colitis, unspecified with unspecified complications: Secondary | ICD-10-CM | POA: Diagnosis not present

## 2018-12-18 DIAGNOSIS — M2550 Pain in unspecified joint: Secondary | ICD-10-CM | POA: Diagnosis not present

## 2018-12-19 DIAGNOSIS — K51919 Ulcerative colitis, unspecified with unspecified complications: Secondary | ICD-10-CM | POA: Diagnosis not present

## 2019-02-12 DIAGNOSIS — K51919 Ulcerative colitis, unspecified with unspecified complications: Secondary | ICD-10-CM | POA: Diagnosis not present

## 2019-03-19 ENCOUNTER — Other Ambulatory Visit: Payer: Self-pay | Admitting: Obstetrics and Gynecology

## 2019-03-19 DIAGNOSIS — Z1231 Encounter for screening mammogram for malignant neoplasm of breast: Secondary | ICD-10-CM

## 2019-04-09 DIAGNOSIS — Z111 Encounter for screening for respiratory tuberculosis: Secondary | ICD-10-CM | POA: Diagnosis not present

## 2019-04-09 DIAGNOSIS — K51919 Ulcerative colitis, unspecified with unspecified complications: Secondary | ICD-10-CM | POA: Diagnosis not present

## 2019-04-10 ENCOUNTER — Other Ambulatory Visit: Payer: Self-pay

## 2019-04-10 NOTE — Telephone Encounter (Signed)

## 2019-04-10 NOTE — Progress Notes (Signed)
Virtual Visit via Video Note   This visit type was conducted due to national recommendations for restrictions regarding the COVID-19 Pandemic (e.g. social distancing) in an effort to limit this patient's exposure and mitigate transmission in our community.  Due to her co-morbid illnesses, this patient is at least at moderate risk for complications without adequate follow up.  This format is felt to be most appropriate for this patient at this time.  All issues noted in this document were discussed and addressed.  A limited physical exam was performed with this format.  Please refer to the patient's chart for her consent to telehealth for [facility].   Date:  04/11/2019   ID:  Tracy Holt, DOB December 08, 1975, MRN [id number]  Patient Location: Home Provider Location: Home  PCP:  Girtha Rm, NP-C  Cardiologist:  Mertie Moores, MD   Electrophysiologist:  None   Evaluation Performed:  Follow-Up Visit  Chief Complaint:  Palpitations   History of Present Illness:    Tracy Holt is a 43 y.o. female with  Palpitations   Event monitor 08/2016: sinus tachycardia   Hypertension   Ulcerative Colitis   Carotid US 11/14/16: normal carotid arteries bilat  She was last seen in 2018.  Since last seen, she has done well without chest pain, shortness of breath, leg swelling.  She has occasional palpitations.  They do not seem to be getting more frequent.  She had weight loss surgery and her blood pressure started to run low.  She did pass out once due to low blood pressure and she was taken off of blood pressure medication.  She used to take carvedilol.  She has an Apple watch and had a high heart rate episode one day at work (she was frustrated with an occurrence at work) and her HR may have increased into the 150s.  She still notes L sided tinnitus.  She has seen ENT as well.  Dr. Acie Fredrickson did a carotid US in 2018 that demonstrated no ICA stenosis.     Past Medical History:  Diagnosis  Date  . Anemia    hx of  . Anxiety   . Colonic polyp   . Costochondritis   . Elevated blood pressure (not hypertension)   . Essential hypertension 12/29/2015  . History of lump in breast   . Hypertension   . Sleep apnea    No machine  . UC (ulcerative colitis) ([place])   . UTI (urinary tract infection)    Past Surgical History:  Procedure Laterality Date  . arthroscopic  knee  10/2016   left  . CESAREAN SECTION     x2  . COLONOSCOPY W/ POLYPECTOMY     x2  . FOOT SURGERY  2010   right plantar facitis surgery  . LAPAROSCOPIC GASTRIC SLEEVE RESECTION N/A 05/15/2017   Procedure: LAPAROSCOPIC GASTRIC SLEEVE RESECTION;  Surgeon: Excell Seltzer, MD;  Location: [facility];  Service: General;  Laterality: N/A;  . TUBAL LIGATION       Current Meds  Medication Sig  . Calcium Carbonate (CALCIUM 600 PO) Take 1,200 mg by mouth.  . Multiple Vitamin (MULTIVITAMIN WITH MINERALS) TABS tablet Take 1 tablet by mouth daily.  . Vedolizumab (ENTYVIO IV) Inject into the vein.     Allergies:   Patient has no known allergies.   Social History   Tobacco Use  . Smoking status: Former Smoker    Quit date: 05/30/2013    Years since quitting: 5.8  . Smokeless tobacco: Never  Used  Substance Use Topics  . Alcohol use: No    Frequency: Never  . Drug use: No     Family Hx: The patient's family history includes Colon polyps in her father; Diabetes in her father, mother, paternal grandfather, and paternal grandmother; Heart disease in her paternal grandfather; Heart failure in her paternal grandmother; Hyperlipidemia in her father; Hypertension in her father and mother; Ovarian cancer in her paternal grandmother; Thyroid disease in her mother. There is no history of Colon cancer or Esophageal cancer.  ROS:   Please see the history of present illness.     All other systems reviewed and are negative.   Prior CV studies:   The following studies were reviewed today:  Carotid US 11/14/16:  normal  carotid arteries bilat  Event Monitor 09/14/16  NSR  Occasional episodes of sinus tachycardia  No significant arrhythmias  Labs/Other Tests and Data Reviewed:    EKG:  No ECG reviewed.  Recent Labs: 04/24/2018: ALT 19; BUN 17; Creatinine, Ser 0.67; Hemoglobin 13.6; Platelets 223.0; Potassium 4.6; Sodium 139   Recent Lipid Panel Lab Results  Component Value Date/Time   CHOL 185 09/13/2016 08:41 AM   TRIG 94 09/13/2016 08:41 AM   HDL 60 09/13/2016 08:41 AM   CHOLHDL 3.1 09/13/2016 08:41 AM   LDLCALC 106 (H) 09/13/2016 08:41 AM    Wt Readings from Last 3 Encounters:  04/11/19 152 lb (68.9 kg)  08/02/18 158 lb 4 oz (71.8 kg)  06/11/18 158 lb 9.6 oz (71.9 kg)     Objective:    Vital Signs:  BP 123/78   Pulse 100   Ht 5' 5"  (1.651 m)   Wt 152 lb (68.9 kg)   BMI 25.29 kg/m    VITAL SIGNS:  reviewed GEN:  no acute distress EYES:  sclerae anicteric, EOMI - Extraocular Movements Intact RESPIRATORY:  normal respiratory effort NEURO:  alert and oriented x 3, no obvious focal deficit PSYCH:  normal affect  ASSESSMENT & PLAN:    1. Heart palpitations She has not really had a significant change in her symptoms despite being off of beta-blocker therapy.  She has noted some high heart rates in the past associated with stress.  We discussed prn beta-blocker therapy to use if needed for palpitations, Valsalva maneuvers if needed for prolonged palpitations and the need for repeat event monitor should her palpitations escalate.  She will let us know if she has had any changes.  Otherwise, follow up in 1 year.    2. Essential hypertension Resolved post weight loss surgery.  She is no longer on medications.      Time:   Today, I have spent 10 minutes with the patient with telehealth technology discussing the above problems.     Medication Adjustments/Labs and Tests Ordered: Current medicines are reviewed at length with the patient today.  Concerns regarding medicines are  outlined above.   Tests Ordered: No orders of the defined types were placed in this encounter.   Medication Changes: No orders of the defined types were placed in this encounter.   Follow Up:  Either In Person or Virtual in 1 year(s)  Signed, Richardson Dopp, PA-C  04/11/2019 11:37 AM    [place]

## 2019-04-11 ENCOUNTER — Telehealth (INDEPENDENT_AMBULATORY_CARE_PROVIDER_SITE_OTHER): Payer: BC Managed Care – PPO | Admitting: Physician Assistant

## 2019-04-11 ENCOUNTER — Other Ambulatory Visit: Payer: Self-pay

## 2019-04-11 ENCOUNTER — Telehealth: Payer: Self-pay | Admitting: *Deleted

## 2019-04-11 VITALS — BP 123/78 | HR 100 | Ht 65.0 in | Wt 152.0 lb

## 2019-04-11 DIAGNOSIS — R002 Palpitations: Secondary | ICD-10-CM

## 2019-04-11 DIAGNOSIS — I10 Essential (primary) hypertension: Secondary | ICD-10-CM

## 2019-04-11 NOTE — Patient Instructions (Signed)
Medication Instructions:  Your physician recommends that you continue on your current medications as directed. Please refer to the Current Medication list given to you today.  *If you need a refill on your cardiac medications before your next appointment, please call your pharmacy*  Lab Work: none If you have labs (blood work) drawn today and your tests are completely normal, you will receive your results only by: Marland Kitchen MyChart Message (if you have MyChart) OR . A paper copy in the mail If you have any lab test that is abnormal or we need to change your treatment, we will call you to review the results.  Testing/Procedures: none  Follow-Up: At [facility], you and your health needs are our priority.  As part of our continuing mission to provide you with exceptional heart care, we have created designated Provider Care Teams.  These Care Teams include your primary Cardiologist (physician) and Advanced Practice Providers (APPs -  Physician Assistants and Nurse Practitioners) who all work together to provide you with the care you need, when you need it.  Your next appointment:   12 months  The format for your next appointment:   Either In Person or Virtual  Provider:   Mertie Moores, MD or Richardson Dopp, [place]  Other Instructions Please call our office if palpitations increase or you feel you need an as needed medication to take for palpitations,

## 2019-04-11 NOTE — Telephone Encounter (Signed)
Virtual Visit Pre-Appointment Phone Call  "(Name), I am calling you today to discuss your upcoming appointment. We are currently trying to limit exposure to the virus that causes COVID-19 by seeing patients at home rather than in the office."  1. "What is the BEST phone number to call the day of the visit?" - include this in appointment notes [phone]  2. "Do you have or have access to (through a family member/friend) a smartphone with video capability that we can use for your visit?" yes a. If yes - list this number in appt notes as "cell" (if different from BEST phone #) and list the appointment type as a VIDEO visit in appointment notes b. If no - list the appointment type as a PHONE visit in appointment notes  3. Confirm consent - "In the setting of the current Covid19 crisis, you are scheduled for a video visit with your provider on October 16,2020.  Just as we do with many in-office visits, in order for you to participate in this visit, we must obtain consent.  If you'd like, I can send this to your mychart (if signed up) or email for you to review.  Otherwise, I can obtain your verbal consent now.  All virtual visits are billed to your insurance company just like a normal visit would be.  By agreeing to a virtual visit, we'd like you to understand that the technology does not allow for your provider to perform an examination, and thus may limit your provider's ability to fully assess your condition. If your provider identifies any concerns that need to be evaluated in person, we will make arrangements to do so.  Finally, though the technology is pretty good, we cannot assure that it will always work on either your or our end, and in the setting of a video visit, we may have to convert it to a phone-only visit.  In either situation, we cannot ensure that we have a secure connection.  Are you willing to proceed?" STAFF: Did the patient verbally acknowledge consent to telehealth visit?  Document YES/NO here: yes.  4. Advise patient to be prepared - "Two hours prior to your appointment, go ahead and check your blood pressure, pulse, oxygen saturation, and your weight (if you have the equipment to check those) and write them all down. When your visit starts, your provider will ask you for this information. If you have an Apple Watch or Kardia device, please plan to have heart rate information ready on the day of your appointment. Please have a pen and paper handy nearby the day of the visit as well."  5. Give patient instructions for MyChart download to smartphone OR [email or web address] as below if video visit (depending on what platform provider is using)  6. Inform patient they will receive a phone call 15 minutes prior to their appointment time (may be from unknown caller ID) so they should be prepared to answer    TELEPHONE CALL NOTE  Toneisha Savary has been deemed a candidate for a follow-up tele-health visit to limit community exposure during the Covid-19 pandemic. I spoke with the patient via phone to ensure availability of phone/video source, confirm preferred email & phone number, and discuss instructions and expectations.  I reminded Tracy Holt to be prepared with any vital sign and/or heart rhythm information that could potentially be obtained via home monitoring, at the time of her visit. I reminded Emorie Mcfate to expect a phone call prior to her visit.  Mardene Celeste  Earley Favor, RN 04/11/2019 8:18 AM   INSTRUCTIONS FOR DOWNLOADING THE MYCHART APP TO SMARTPHONE  - The patient must first make sure to have activated MyChart and know their login information - If Apple, go to [employer] and type in MyChart in the search bar and download the app. If Android, ask patient to go to [employer] and type in [place] in the search bar and download the app. The app is free but as with any other app downloads, their phone may require them to verify saved payment information or  Apple/Android password.  - The patient will need to then log into the app with their MyChart username and password, and select [facility] as their healthcare provider to link the account. When it is time for your visit, go to the MyChart app, find appointments, and click Begin Video Visit. Be sure to Select Allow for your device to access the Microphone and Camera for your visit. You will then be connected, and your provider will be with you shortly.  **If they have any issues connecting, or need assistance please contact MyChart service desk [phone])**  **If using a computer, in order to ensure the best quality for their visit they will need to use either of the following Internet Browsers: [employer], or Google Chrome**  IF USING DOXIMITY or DOXY.ME - The patient will receive a link just prior to their visit by text.     FULL LENGTH CONSENT FOR TELE-HEALTH VISIT   I hereby voluntarily request, consent and authorize [facility] and its employed or contracted physicians, physician assistants, nurse practitioners or other licensed health care professionals (the Practitioner), to provide me with telemedicine health care services (the "Services") as deemed necessary by the treating Practitioner. I acknowledge and consent to receive the Services by the Practitioner via telemedicine. I understand that the telemedicine visit will involve communicating with the Practitioner through live audiovisual communication technology and the disclosure of certain medical information by electronic transmission. I acknowledge that I have been given the opportunity to request an in-person assessment or other available alternative prior to the telemedicine visit and am voluntarily participating in the telemedicine visit.  I understand that I have the right to withhold or withdraw my consent to the use of telemedicine in the course of my care at any time, without affecting my right to future care  or treatment, and that the Practitioner or I may terminate the telemedicine visit at any time. I understand that I have the right to inspect all information obtained and/or recorded in the course of the telemedicine visit and may receive copies of available information for a reasonable fee.  I understand that some of the potential risks of receiving the Services via telemedicine include:  Marland Kitchen Delay or interruption in medical evaluation due to technological equipment failure or disruption; . Information transmitted may not be sufficient (e.g. poor resolution of images) to allow for appropriate medical decision making by the Practitioner; and/or  . In rare instances, security protocols could fail, causing a breach of personal health information.  Furthermore, I acknowledge that it is my responsibility to provide information about my medical history, conditions and care that is complete and accurate to the best of my ability. I acknowledge that Practitioner's advice, recommendations, and/or decision may be based on factors not within their control, such as incomplete or inaccurate data provided by me or distortions of diagnostic images or specimens that may result from electronic transmissions. I understand that the practice  of medicine is not an Chief Strategy Officer and that Practitioner makes no warranties or guarantees regarding treatment outcomes. I acknowledge that I will receive a copy of this consent concurrently upon execution via email to the email address I last provided but may also request a printed copy by calling the office of [place].    I understand that my insurance will be billed for this visit.   I have read or had this consent read to me. . I understand the contents of this consent, which adequately explains the benefits and risks of the Services being provided via telemedicine.  . I have been provided ample opportunity to ask questions regarding this consent and the Services and have had  my questions answered to my satisfaction. . I give my informed consent for the services to be provided through the use of telemedicine in my medical care  By participating in this telemedicine visit I agree to the above.

## 2019-05-05 ENCOUNTER — Other Ambulatory Visit: Payer: Self-pay

## 2019-05-05 ENCOUNTER — Ambulatory Visit
Admission: RE | Admit: 2019-05-05 | Discharge: 2019-05-05 | Disposition: A | Discharge status: Home / Self Care | Payer: BC Managed Care – PPO | Source: Ambulatory Visit | Attending: Obstetrics and Gynecology | Admitting: Obstetrics and Gynecology

## 2019-05-05 DIAGNOSIS — Z1231 Encounter for screening mammogram for malignant neoplasm of breast: Secondary | ICD-10-CM

## 2019-05-19 ENCOUNTER — Encounter: Payer: Self-pay | Admitting: Podiatry

## 2019-05-19 ENCOUNTER — Ambulatory Visit (INDEPENDENT_AMBULATORY_CARE_PROVIDER_SITE_OTHER): Payer: BC Managed Care – PPO

## 2019-05-19 ENCOUNTER — Other Ambulatory Visit: Payer: Self-pay

## 2019-05-19 ENCOUNTER — Ambulatory Visit (INDEPENDENT_AMBULATORY_CARE_PROVIDER_SITE_OTHER): Payer: BC Managed Care – PPO | Admitting: Orthopaedic Surgery

## 2019-05-19 ENCOUNTER — Ambulatory Visit (INDEPENDENT_AMBULATORY_CARE_PROVIDER_SITE_OTHER): Payer: BC Managed Care – PPO | Admitting: Podiatry

## 2019-05-19 DIAGNOSIS — M2012 Hallux valgus (acquired), left foot: Secondary | ICD-10-CM | POA: Diagnosis not present

## 2019-05-19 DIAGNOSIS — B070 Plantar wart: Secondary | ICD-10-CM | POA: Diagnosis not present

## 2019-05-19 DIAGNOSIS — M2011 Hallux valgus (acquired), right foot: Secondary | ICD-10-CM

## 2019-05-19 DIAGNOSIS — M7062 Trochanteric bursitis, left hip: Secondary | ICD-10-CM

## 2019-05-19 MED ORDER — METHYLPREDNISOLONE ACETATE 40 MG/ML IJ SUSP
40.0000 mg | INTRAMUSCULAR | Status: AC | PRN
  Administered 2019-05-19: 40 mg via INTRA_ARTICULAR

## 2019-05-19 MED ORDER — LIDOCAINE HCL 1 % IJ SOLN
3.0000 mL | INTRAMUSCULAR | Status: AC | PRN
  Administered 2019-05-19: 3 mL

## 2019-05-19 NOTE — Patient Instructions (Signed)
Bunion  A bunion is a bump on the base of the big toe that forms when the bones of the big toe joint move out of position. Bunions may be small at first, but they often get larger over time. They can make walking painful. What are the causes? A bunion may be caused by:  Wearing narrow or pointed shoes that force the big toe to press against the other toes.  Abnormal foot development that causes the foot to roll inward (pronate).  Changes in the foot that are caused by certain diseases, such as rheumatoid arthritis or polio.  A foot injury. What increases the risk? The following factors may make you more likely to develop this condition:  Wearing shoes that squeeze the toes together.  Having certain diseases, such as: ? Rheumatoid arthritis. ? Polio. ? Cerebral palsy.  Having family members who have bunions.  Being born with a foot deformity, such as flat feet or low arches.  Doing activities that put a lot of pressure on the feet, such as ballet dancing. What are the signs or symptoms? The main symptom of a bunion is a noticeable bump on the big toe. Other symptoms may include:  Pain.  Swelling around the big toe.  Redness and inflammation.  Thick or hardened skin on the big toe or between the toes.  Stiffness or loss of motion in the big toe.  Trouble with walking. How is this diagnosed? A bunion may be diagnosed based on your symptoms, medical history, and activities. You may have tests, such as:  X-rays. These allow your health care provider to check the position of the bones in your foot and look for damage to your joint. They also help your health care provider determine the severity of your bunion and the best way to treat it.  Joint aspiration. In this test, a sample of fluid is removed from the toe joint. This test may be done if you are in a lot of pain. It helps rule out diseases that cause painful swelling of the joints, such as arthritis. How is this  treated? Treatment depends on the severity of your symptoms. The goal of treatment is to relieve symptoms and prevent the bunion from getting worse. Your health care provider may recommend:  Wearing shoes that have a wide toe box.  Using bunion pads to cushion the affected area.  Taping your toes together to keep them in a normal position.  Placing a device inside your shoe (orthotics) to help reduce pressure on your toe joint.  Taking medicine to ease pain, inflammation, and swelling.  Applying heat or ice to the affected area.  Doing stretching exercises.  Surgery to remove scar tissue and move the toes back into their normal position. This treatment is rare. Follow these instructions at home: Managing pain, stiffness, and swelling   If directed, put ice on the painful area: ? Put ice in a plastic bag. ? Place a towel between your skin and the bag. ? Leave the ice on for 20 minutes, 2-3 times a day. Activity   If directed, apply heat to the affected area before you exercise. Use the heat source that your health care provider recommends, such as a moist heat pack or a heating pad. ? Place a towel between your skin and the heat source. ? Leave the heat on for 20-30 minutes. ? Remove the heat if your skin turns bright red. This is especially important if you are unable to feel pain,   heat, or cold. You may have a greater risk of getting burned.  Do exercises as told by your health care provider. General instructions  Support your toe joint with proper footwear, shoe padding, or taping as told by your health care provider.  Take over-the-counter and prescription medicines only as told by your health care provider.  Keep all follow-up visits as told by your health care provider. This is important. Contact a health care provider if your symptoms:  Get worse.  Do not improve in 2 weeks. Get help right away if you have:  Severe pain and trouble with walking. Summary  A  bunion is a bump on the base of the big toe that forms when the bones of the big toe joint move out of position.  Bunions can make walking painful.  Treatment depends on the severity of your symptoms.  Support your toe joint with proper footwear, shoe padding, or taping as told by your health care provider. This information is not intended to replace advice given to you by your health care provider. Make sure you discuss any questions you have with your health care provider. Document Released: 06/12/2005 Document Revised: 12/17/2017 Document Reviewed: 10/23/2017 Elsevier Patient Education  2020 Elsevier Inc.  

## 2019-05-19 NOTE — Progress Notes (Signed)
Office Visit Note   Patient: Tracy Holt           Date of Birth: [birth year]           MRN: [id number] Visit Date: 05/19/2019              Requested by: Girtha Rm, NP-C [place],  [place] [address] PCP: Girtha Rm, NP-C   Assessment & Plan: Visit Diagnoses:  1. Trochanteric bursitis, left hip     Plan: A lot of the pain seems to be over the left hip trochanteric area and the IT band so I felt that it injection and is here with a steroid to be useful and helpful and she agreed with this as well and tolerated the injection.  She understands the risk and benefits of steroid injections.  I do feel it is worthwhile to try physical therapy on both her hip and lumbar spine as well as IT band and I gave her a prescription for outpatient therapy where she is going to look for in [facility] where she is from.  All question concerns were answered and addressed.  We will see her back in about 6 weeks to see if this is helped.  Follow-Up Instructions: Return in about 6 weeks (around 06/30/2019).   Orders:  Orders Placed This Encounter  Procedures  . Large Joint Inj   No orders of the defined types were placed in this encounter.     Procedures: Large Joint Inj: L greater trochanter on 05/19/2019 4:43 PM Indications: pain and diagnostic evaluation Details: 22 G 1.5 in needle, lateral approach  Arthrogram: No  Medications: 3 mL lidocaine 1 %; 40 mg methylPREDNISolone acetate 40 MG/ML Outcome: tolerated well, no immediate complications Procedure, treatment alternatives, risks and benefits explained, specific risks discussed. Consent was given by the patient. Immediately prior to procedure a time out was called to verify the correct patient, procedure, equipment, support staff and site/side marked as required. Patient was prepped and draped in the usual sterile fashion.       Clinical Data: No additional findings.   Subjective: Chief  Complaint  Patient presents with  . Lower Back - Pain, Follow-up  . Left Leg - Follow-up, Pain  The patient is someone that I have not seen in a year and a half.  At the time she is having low back pain with radicular symptoms down her left leg.  A MRI of the lumbar spine did show a paracentral disc at L4-L5 but more to the right.  She [place] in [place] part [place] the [place] but also lives here in [place].  She travels back and forth between the 2 locations.  She is complaining of pain that was the same as last year.  She points to the low back but also the trochanteric area of her left hip.  It does radiate down to her knee but not past her knee.  She denies any change in bowel bladder function and denies any weakness.  She describes numbness and tingling type of pain as well.  She denies any groin pain.  She denies any left knee instability or locking and catching.  HPI  Review of Systems She currently denies any headache, chest pain, shortness of breath, fever, chills, nausea, vomiting  Objective: Vital Signs: There were no vitals taken for this visit.  Physical Exam She is alert and orient x3 and in no acute distress Ortho Exam Examination of her left  hip and knee are normal.  She has 5 out of 5 strength in bilateral lower extremities.  She has normal sensation bilaterally in the lower extremities.  She has pain to palpation of the trochanteric area and IT band on the left side.  There is also some low back pain to the right side. Specialty Comments:  No specialty comments available.  Imaging: No results found.   PMFS History: Patient Active Problem List   Diagnosis Date Noted  . Unilateral primary osteoarthritis, left knee 03/19/2017  . Status post arthroscopy of left knee 11/23/2016  . Heart palpitations 09/13/2016  . Routine general medical examination at a health care facility 12/29/2015   Past Medical History:  Diagnosis Date  . Anemia    hx of  . Anxiety   . Colonic  polyp   . Costochondritis   . Elevated blood pressure (not hypertension)   . Essential hypertension 12/29/2015  . History of lump in breast   . Hypertension   . Sleep apnea    No machine  . UC (ulcerative colitis) ([place])   . UTI (urinary tract infection)     Family History  Problem Relation Age of Onset  . Thyroid disease Mother   . Diabetes Mother   . Hypertension Mother   . Diabetes Father   . Hyperlipidemia Father   . Hypertension Father   . Colon polyps Father   . Heart failure Paternal Grandmother   . Diabetes Paternal Grandmother   . Ovarian cancer Paternal Grandmother   . Heart disease Paternal Grandfather   . Diabetes Paternal Grandfather   . Colon cancer Neg Hx   . Esophageal cancer Neg Hx     Past Surgical History:  Procedure Laterality Date  . arthroscopic  knee  10/2016   left  . CESAREAN SECTION     x2  . COLONOSCOPY W/ POLYPECTOMY     x2  . FOOT SURGERY  2010   right plantar facitis surgery  . LAPAROSCOPIC GASTRIC SLEEVE RESECTION N/A 05/15/2017   Procedure: LAPAROSCOPIC GASTRIC SLEEVE RESECTION;  Surgeon: Excell Seltzer, MD;  Location: [facility];  Service: General;  Laterality: N/A;  . TUBAL LIGATION     Social History   Occupational History  . Occupation: VP of branch ops  Tobacco Use  . Smoking status: Former Smoker    Quit date: 05/30/2013    Years since quitting: 5.9  . Smokeless tobacco: Never Used  Substance and Sexual Activity  . Alcohol use: No    Frequency: Never  . Drug use: No  . Sexual activity: Yes    Birth control/protection: None    Comment: 2 kids. 1-2 cigs per day for intermittent for 10 years

## 2019-05-20 NOTE — Progress Notes (Signed)
Subjective:   Patient ID: Tracy Holt, female   DOB: 43 y.o.   MRN: [id number]   HPI Patient presents stating the lesion on the right has become painful again and I know I am getting need to get this bunion fixed on the left just deciding on time   ROS      Objective:  Physical Exam  Neurovascular status intact with keratotic lesion subsecond metatarsal right that is moderately tender with structural bunion deformity left of a moderate nature     Assessment:  HAV deformity left with pain along with keratotic lesion subsecond metatarsal right     Plan:  H&P x-ray reviewed and went over different treatment options for bunion deformity and lesion and at this point we discussed distal osteotomy recovery and she is going to decide on her schedule schedule and then see me prior with education rendered today.  Debrided lesion right which was tolerated well and it does appear that there may be a small verruca is component to this and I debrided it fully applied sterile dressing and applied salicylic acid smear to try to reduce any form of verruca material

## 2019-05-28 ENCOUNTER — Telehealth: Payer: Self-pay | Admitting: *Deleted

## 2019-05-28 NOTE — Telephone Encounter (Signed)
"  Graylon Good, you can reach me at [phone]."

## 2019-06-02 NOTE — Telephone Encounter (Signed)
Left voicemail to call me back directly to see how I can assist her.

## 2019-06-02 NOTE — Telephone Encounter (Signed)
Pt called saying she saw Dr. Paulla Dolly and they discussed her having surgery for her bunion on her left foot and she wanted to get it scheduled. I asked the pt if she signed the 3 page consent forms and she stated she had not. I told the pt I would need to get her scheduled to come in for a consult and they could schedule her surgery date that well. I scheduled pt a sx consult with Dr. Paulla Dolly on Monday, 12/21 at 9:15 am. Pt is hoping to be scheduled for surgery on 07/01/2019.

## 2019-06-04 DIAGNOSIS — K51919 Ulcerative colitis, unspecified with unspecified complications: Secondary | ICD-10-CM | POA: Diagnosis not present

## 2019-06-16 ENCOUNTER — Encounter: Payer: Self-pay | Admitting: Podiatry

## 2019-06-16 ENCOUNTER — Other Ambulatory Visit: Payer: Self-pay

## 2019-06-16 ENCOUNTER — Ambulatory Visit (INDEPENDENT_AMBULATORY_CARE_PROVIDER_SITE_OTHER): Payer: BC Managed Care – PPO | Admitting: Podiatry

## 2019-06-16 VITALS — Temp 97.7°F

## 2019-06-16 DIAGNOSIS — B070 Plantar wart: Secondary | ICD-10-CM

## 2019-06-16 DIAGNOSIS — M2012 Hallux valgus (acquired), left foot: Secondary | ICD-10-CM | POA: Diagnosis not present

## 2019-06-16 DIAGNOSIS — M2011 Hallux valgus (acquired), right foot: Secondary | ICD-10-CM

## 2019-06-16 NOTE — Patient Instructions (Signed)
Pre-Operative Instructions  Congratulations, you have decided to take an important step towards improving your quality of life.  You can be assured that the doctors and staff at [facility] will be with you every step of the way.  Here are some important things you should know:  1. Plan to be at the surgery center/hospital at least 1 (one) hour prior to your scheduled time, unless otherwise directed by the surgical center/hospital staff.  You must have a responsible adult accompany you, remain during the surgery and drive you home.  Make sure you have directions to the surgical center/hospital to ensure you arrive on time. 2. If you are having surgery at [facility] or [facility], you will need a copy of your medical history and physical form from your family physician within one month prior to the date of surgery. We will give you a form for your primary physician to complete.  3. We make every effort to accommodate the date you request for surgery.  However, there are times where surgery dates or times have to be moved.  We will contact you as soon as possible if a change in schedule is required.   4. No aspirin/ibuprofen for one week before surgery.  If you are on aspirin, any non-steroidal anti-inflammatory medications (Mobic, Aleve, Ibuprofen) should not be taken seven (7) days prior to your surgery.  You make take Tylenol for pain prior to surgery.  5. Medications - If you are taking daily heart and blood pressure medications, seizure, reflux, allergy, asthma, anxiety, pain or diabetes medications, make sure you notify the surgery center/hospital before the day of surgery so they can tell you which medications you should take or avoid the day of surgery. 6. No food or drink after midnight the night before surgery unless directed otherwise by surgical center/hospital staff. 7. No alcoholic beverages 24-hours prior to surgery.  No smoking 24-hours prior or 24-hours after  surgery. 8. Wear loose pants or shorts. They should be loose enough to fit over bandages, boots, and casts. 9. Don't wear slip-on shoes. Sneakers are preferred. 10. Bring your boot with you to the surgery center/hospital.  Also bring crutches or a walker if your physician has prescribed it for you.  If you do not have this equipment, it will be provided for you after surgery. 11. If you have not been contacted by the surgery center/hospital by the day before your surgery, call to confirm the date and time of your surgery. 12. Leave-time from work may vary depending on the type of surgery you have.  Appropriate arrangements should be made prior to surgery with your employer. 13. Prescriptions will be provided immediately following surgery by your doctor.  Fill these as soon as possible after surgery and take the medication as directed. Pain medications will not be refilled on weekends and must be approved by the doctor. 14. Remove nail polish on the operative foot and avoid getting pedicures prior to surgery. 15. Wash the night before surgery.  The night before surgery wash the foot and leg well with water and the antibacterial soap provided. Be sure to pay special attention to beneath the toenails and in between the toes.  Wash for at least three (3) minutes. Rinse thoroughly with water and dry well with a towel.  Perform this wash unless told not to do so by your physician.  Enclosed: 1 Ice pack (please put in freezer the night before surgery)   1 Hibiclens skin cleaner     Pre-op instructions  If you have any questions regarding the instructions, please do not hesitate to call our office.  [place]: [address], [place], [place] [address] -- [phone]  [place]: [address] [place], [place] [address] -- [phone]  [place]: [address], [place], [place] [address] -- [phone]   Website: [email or web address] 

## 2019-06-16 NOTE — Progress Notes (Signed)
Subjective:   Patient ID: Tracy Holt, female   DOB: 43 y.o.   MRN: [id number]   HPI Patient presents stating his bunion is getting worse I cannot wear shoe gear comfortably and I would like to get it corrected after the first the year   ROS      Objective:  Physical Exam  Neurovascular status intact with patient found to have prominent first metatarsal head left painful when pressed with redness around the joint surface and inability to wear shoe gear without discomfort     Assessment:  Chronic structural bunion deformity left with pain     Plan:  H&P reviewed condition recommended correction and allowed patient to read consent form going over alternative treatments and complications.  Patient wants surgery understanding the risk associated with this and after extensive review she signed consent form understanding total recovery can take 6 months to 1 year and there is no long-term guarantees and I did dispense air fracture walker today with all instructions on usage.  Gave her preoperative instructions and encouraged her to call us with questions concerns

## 2019-06-24 ENCOUNTER — Telehealth: Payer: Self-pay | Admitting: Podiatry

## 2019-06-24 NOTE — Telephone Encounter (Signed)
DOS: 07/01/2019  SURGICAL PROCEDURE: Altamese Soso w/ Fixation [id number])  BCBS Policy Effective : 00/44/7158  -  06/25/2198  Member Liability Summary       In-Network   Max Per Benefit Period Year-to-Date Remaining     CoInsurance 0%       Deductible $2500.00             $0.00     Out-Of-Pocket 3 $2500.00 $0.00 3 Out-of-Pocket includes copay, deductible, and coinsurance. 4 Account balance may not reflect claims currently in process or not yet filed against the account.  [place] Authorization Required Not Applicable 0%  per  Service Year No

## 2019-06-25 ENCOUNTER — Telehealth: Payer: Self-pay | Admitting: Podiatry

## 2019-06-25 NOTE — Telephone Encounter (Signed)
I got a text yesterday for my pre op instructions. Will you give me the time for my surgery on Tuesday? I told the pt someone from the surgical center would call her Monday to let her know what time to arrive, but it would be in the morning. Pt stated she is coming from [facility] and didn't know if she should come up Monday evening to stay the night. I told the pt she could call the surgery center today or tomorrow and they may be able to go ahead and give her the time to arrive.

## 2019-06-26 DIAGNOSIS — M7072 Other bursitis of hip, left hip: Secondary | ICD-10-CM | POA: Diagnosis not present

## 2019-06-26 DIAGNOSIS — M545 Low back pain: Secondary | ICD-10-CM | POA: Diagnosis not present

## 2019-06-26 DIAGNOSIS — R29898 Other symptoms and signs involving the musculoskeletal system: Secondary | ICD-10-CM | POA: Diagnosis not present

## 2019-06-26 DIAGNOSIS — M25552 Pain in left hip: Secondary | ICD-10-CM | POA: Diagnosis not present

## 2019-06-30 ENCOUNTER — Ambulatory Visit: Payer: BC Managed Care – PPO | Admitting: Orthopaedic Surgery

## 2019-06-30 MED ORDER — ONDANSETRON HCL 4 MG PO TABS: 4.0000 mg | ORAL_TABLET | Freq: Three times a day (TID) | ORAL | 0 refills | Status: DC | PRN

## 2019-06-30 MED ORDER — OXYCODONE-ACETAMINOPHEN 10-325 MG PO TABS: 1.0000 | ORAL_TABLET | ORAL | 0 refills | Status: DC | PRN

## 2019-06-30 NOTE — Addendum Note (Signed)
Addended by: Wallene Huh on: 06/30/2019 06:04 PM   Modules accepted: Orders

## 2019-07-01 ENCOUNTER — Encounter: Payer: Self-pay | Admitting: Podiatry

## 2019-07-01 DIAGNOSIS — M2012 Hallux valgus (acquired), left foot: Secondary | ICD-10-CM | POA: Diagnosis not present

## 2019-07-01 DIAGNOSIS — K5190 Ulcerative colitis, unspecified, without complications: Secondary | ICD-10-CM | POA: Diagnosis not present

## 2019-07-07 DIAGNOSIS — M25552 Pain in left hip: Secondary | ICD-10-CM | POA: Diagnosis not present

## 2019-07-07 DIAGNOSIS — M7072 Other bursitis of hip, left hip: Secondary | ICD-10-CM | POA: Diagnosis not present

## 2019-07-07 DIAGNOSIS — R29898 Other symptoms and signs involving the musculoskeletal system: Secondary | ICD-10-CM | POA: Diagnosis not present

## 2019-07-07 DIAGNOSIS — M545 Low back pain: Secondary | ICD-10-CM | POA: Diagnosis not present

## 2019-07-10 DIAGNOSIS — R29898 Other symptoms and signs involving the musculoskeletal system: Secondary | ICD-10-CM | POA: Diagnosis not present

## 2019-07-10 DIAGNOSIS — M7072 Other bursitis of hip, left hip: Secondary | ICD-10-CM | POA: Diagnosis not present

## 2019-07-10 DIAGNOSIS — M545 Low back pain: Secondary | ICD-10-CM | POA: Diagnosis not present

## 2019-07-10 DIAGNOSIS — M25552 Pain in left hip: Secondary | ICD-10-CM | POA: Diagnosis not present

## 2019-07-11 ENCOUNTER — Encounter: Payer: Self-pay | Admitting: Podiatry

## 2019-07-11 ENCOUNTER — Ambulatory Visit (INDEPENDENT_AMBULATORY_CARE_PROVIDER_SITE_OTHER): Payer: BC Managed Care – PPO | Admitting: Podiatry

## 2019-07-11 ENCOUNTER — Other Ambulatory Visit: Payer: Self-pay

## 2019-07-11 ENCOUNTER — Other Ambulatory Visit: Payer: Self-pay | Admitting: Podiatry

## 2019-07-11 ENCOUNTER — Ambulatory Visit (INDEPENDENT_AMBULATORY_CARE_PROVIDER_SITE_OTHER): Payer: BC Managed Care – PPO

## 2019-07-11 DIAGNOSIS — M79672 Pain in left foot: Secondary | ICD-10-CM

## 2019-07-11 DIAGNOSIS — M2012 Hallux valgus (acquired), left foot: Secondary | ICD-10-CM

## 2019-07-11 DIAGNOSIS — Q666 Other congenital valgus deformities of feet: Secondary | ICD-10-CM | POA: Diagnosis not present

## 2019-07-11 DIAGNOSIS — M2011 Hallux valgus (acquired), right foot: Secondary | ICD-10-CM

## 2019-07-11 NOTE — Progress Notes (Signed)
Subjective:   Patient ID: Tracy Holt, female   DOB: 44 y.o.   MRN: [id number]   HPI Patient presents stating she is doing really well with her surgery left foot and is able to walk in her boot comfortably now with minimal discomfort and is still elevating   ROS      Objective:  Physical Exam  Neurovascular status intact negative [name]' sign noted wound edges well coapted left foot hallux in rectus position and structural correction looks good with good range of motion     Assessment:  Doing well post osteotomy first metatarsal left with good alignment and good range of motion currently     Plan:  H&P reviewed condition and recommended continued elevation compression immobilization and reapplied sterile dressing.  Gave instructions on continued boot surgical shoe usage and reappoint the next several weeks or earlier if needed  X-rays look good with alignment good fixation and plate joint congruence

## 2019-07-14 DIAGNOSIS — R29898 Other symptoms and signs involving the musculoskeletal system: Secondary | ICD-10-CM | POA: Diagnosis not present

## 2019-07-14 DIAGNOSIS — M25552 Pain in left hip: Secondary | ICD-10-CM | POA: Diagnosis not present

## 2019-07-14 DIAGNOSIS — M7072 Other bursitis of hip, left hip: Secondary | ICD-10-CM | POA: Diagnosis not present

## 2019-07-14 DIAGNOSIS — M545 Low back pain: Secondary | ICD-10-CM | POA: Diagnosis not present

## 2019-07-17 ENCOUNTER — Encounter: Payer: BC Managed Care – PPO | Admitting: Podiatry

## 2019-07-17 DIAGNOSIS — Z01419 Encounter for gynecological examination (general) (routine) without abnormal findings: Secondary | ICD-10-CM | POA: Diagnosis not present

## 2019-07-21 DIAGNOSIS — M25552 Pain in left hip: Secondary | ICD-10-CM | POA: Diagnosis not present

## 2019-07-21 DIAGNOSIS — M7072 Other bursitis of hip, left hip: Secondary | ICD-10-CM | POA: Diagnosis not present

## 2019-07-21 DIAGNOSIS — M545 Low back pain: Secondary | ICD-10-CM | POA: Diagnosis not present

## 2019-07-21 DIAGNOSIS — R29898 Other symptoms and signs involving the musculoskeletal system: Secondary | ICD-10-CM | POA: Diagnosis not present

## 2019-07-23 DIAGNOSIS — R29898 Other symptoms and signs involving the musculoskeletal system: Secondary | ICD-10-CM | POA: Diagnosis not present

## 2019-07-23 DIAGNOSIS — M25552 Pain in left hip: Secondary | ICD-10-CM | POA: Diagnosis not present

## 2019-07-23 DIAGNOSIS — M7072 Other bursitis of hip, left hip: Secondary | ICD-10-CM | POA: Diagnosis not present

## 2019-07-23 DIAGNOSIS — M545 Low back pain: Secondary | ICD-10-CM | POA: Diagnosis not present

## 2019-07-25 ENCOUNTER — Encounter: Payer: BC Managed Care – PPO | Admitting: Podiatry

## 2019-07-30 DIAGNOSIS — K51919 Ulcerative colitis, unspecified with unspecified complications: Secondary | ICD-10-CM | POA: Diagnosis not present

## 2019-08-06 DIAGNOSIS — R29898 Other symptoms and signs involving the musculoskeletal system: Secondary | ICD-10-CM | POA: Diagnosis not present

## 2019-08-06 DIAGNOSIS — M545 Low back pain: Secondary | ICD-10-CM | POA: Diagnosis not present

## 2019-08-06 DIAGNOSIS — M25552 Pain in left hip: Secondary | ICD-10-CM | POA: Diagnosis not present

## 2019-08-06 DIAGNOSIS — M7072 Other bursitis of hip, left hip: Secondary | ICD-10-CM | POA: Diagnosis not present

## 2019-08-08 ENCOUNTER — Encounter: Payer: BC Managed Care – PPO | Admitting: Podiatry

## 2019-08-13 ENCOUNTER — Encounter: Payer: Self-pay | Admitting: Podiatry

## 2019-08-13 ENCOUNTER — Ambulatory Visit (INDEPENDENT_AMBULATORY_CARE_PROVIDER_SITE_OTHER): Payer: BC Managed Care – PPO

## 2019-08-13 ENCOUNTER — Ambulatory Visit (INDEPENDENT_AMBULATORY_CARE_PROVIDER_SITE_OTHER): Payer: BC Managed Care – PPO | Admitting: Podiatry

## 2019-08-13 ENCOUNTER — Other Ambulatory Visit: Payer: Self-pay

## 2019-08-13 VITALS — Temp 98.0°F

## 2019-08-13 DIAGNOSIS — M2012 Hallux valgus (acquired), left foot: Secondary | ICD-10-CM

## 2019-08-13 NOTE — Progress Notes (Signed)
Subjective:   Patient ID: Tracy Holt, female   DOB: 44 y.o.   MRN: [id number]   HPI States doing well with discomfort still only she has been real active and so far very pleased with the results of surgery   ROS      Objective:  Physical Exam  Vascular status intact negative [name]' sign noted left foot healing well wound edges well coapted hallux in rectus position good range of motion no crepitus     Assessment:  Doing well post osteotomy first metatarsal left with good alignment joint congruence     Plan:  H&P reviewed condition discussed continue range of motion dispensed ankle compression stocking discussed elevation as needed  X-rays indicate osteotomies healing well fixation in place

## 2019-08-14 DIAGNOSIS — M25552 Pain in left hip: Secondary | ICD-10-CM | POA: Diagnosis not present

## 2019-08-14 DIAGNOSIS — M7072 Other bursitis of hip, left hip: Secondary | ICD-10-CM | POA: Diagnosis not present

## 2019-08-14 DIAGNOSIS — M545 Low back pain: Secondary | ICD-10-CM | POA: Diagnosis not present

## 2019-08-14 DIAGNOSIS — R29898 Other symptoms and signs involving the musculoskeletal system: Secondary | ICD-10-CM | POA: Diagnosis not present

## 2019-08-18 ENCOUNTER — Ambulatory Visit: Payer: BC Managed Care – PPO | Admitting: Orthopaedic Surgery

## 2019-08-28 DIAGNOSIS — K51919 Ulcerative colitis, unspecified with unspecified complications: Secondary | ICD-10-CM | POA: Diagnosis not present

## 2019-08-28 DIAGNOSIS — M2550 Pain in unspecified joint: Secondary | ICD-10-CM | POA: Diagnosis not present

## 2019-09-15 DIAGNOSIS — Z23 Encounter for immunization: Secondary | ICD-10-CM | POA: Diagnosis not present

## 2019-09-22 DIAGNOSIS — U071 COVID-19: Secondary | ICD-10-CM | POA: Diagnosis not present

## 2019-09-22 DIAGNOSIS — Z03818 Encounter for observation for suspected exposure to other biological agents ruled out: Secondary | ICD-10-CM | POA: Diagnosis not present

## 2019-09-24 ENCOUNTER — Encounter: Payer: BC Managed Care – PPO | Admitting: Podiatry

## 2019-10-08 ENCOUNTER — Ambulatory Visit (INDEPENDENT_AMBULATORY_CARE_PROVIDER_SITE_OTHER): Payer: BC Managed Care – PPO

## 2019-10-08 ENCOUNTER — Other Ambulatory Visit: Payer: Self-pay

## 2019-10-08 ENCOUNTER — Ambulatory Visit (INDEPENDENT_AMBULATORY_CARE_PROVIDER_SITE_OTHER): Payer: BC Managed Care – PPO | Admitting: Podiatry

## 2019-10-08 ENCOUNTER — Encounter: Payer: Self-pay | Admitting: Podiatry

## 2019-10-08 VITALS — Temp 97.4°F

## 2019-10-08 DIAGNOSIS — M2012 Hallux valgus (acquired), left foot: Secondary | ICD-10-CM | POA: Diagnosis not present

## 2019-10-08 DIAGNOSIS — K51919 Ulcerative colitis, unspecified with unspecified complications: Secondary | ICD-10-CM | POA: Diagnosis not present

## 2019-10-09 DIAGNOSIS — Z23 Encounter for immunization: Secondary | ICD-10-CM | POA: Diagnosis not present

## 2019-10-09 NOTE — Progress Notes (Signed)
Subjective:   Patient ID: Tracy Holt, female   DOB: 44 y.o.   MRN: [id number]   HPI Patient states doing really well with her left foot and states that it is been improving and she is satisfied so far   ROS      Objective:  Physical Exam  Neurovascular status intact negative [name]' sign noted left first metatarsal doing well wound edges well coapted hallux in rectus position with no crepitus of the joint noted doing     Assessment:  Well post Liane Comber osteotomy left     Plan:  H&P condition reviewed and recommended gradual return to normal activity elevation as needed compression as needed ice and anti-inflammatories as needed.  Return to shoe gear as she feels comfortable and patient is discharged will be seen back as needed  X-rays indicate osteotomies healing well fixation in place joint congruence

## 2019-10-23 DIAGNOSIS — M25561 Pain in right knee: Secondary | ICD-10-CM | POA: Diagnosis not present

## 2019-10-31 DIAGNOSIS — M25561 Pain in right knee: Secondary | ICD-10-CM | POA: Diagnosis not present

## 2019-11-07 DIAGNOSIS — M25561 Pain in right knee: Secondary | ICD-10-CM | POA: Diagnosis not present

## 2019-12-03 ENCOUNTER — Encounter (HOSPITAL_COMMUNITY): Payer: Self-pay

## 2019-12-12 DIAGNOSIS — Z79899 Other long term (current) drug therapy: Secondary | ICD-10-CM | POA: Diagnosis not present

## 2019-12-12 DIAGNOSIS — K51919 Ulcerative colitis, unspecified with unspecified complications: Secondary | ICD-10-CM | POA: Diagnosis not present

## 2019-12-22 ENCOUNTER — Encounter: Payer: Self-pay | Admitting: Family Medicine

## 2019-12-25 ENCOUNTER — Telehealth: Payer: BC Managed Care – PPO | Admitting: Family Medicine

## 2019-12-25 ENCOUNTER — Encounter: Payer: Self-pay | Admitting: Family Medicine

## 2019-12-25 ENCOUNTER — Other Ambulatory Visit: Payer: Self-pay

## 2019-12-25 VITALS — BP 122/70 | Wt 156.0 lb

## 2019-12-25 DIAGNOSIS — Z566 Other physical and mental strain related to work: Secondary | ICD-10-CM | POA: Diagnosis not present

## 2019-12-25 DIAGNOSIS — F419 Anxiety disorder, unspecified: Secondary | ICD-10-CM

## 2019-12-25 MED ORDER — ESCITALOPRAM OXALATE 10 MG PO TABS: 10.0000 mg | ORAL_TABLET | Freq: Every day | ORAL | 2 refills | Status: DC

## 2019-12-25 MED ORDER — ALPRAZOLAM 0.25 MG PO TABS: 0.2500 mg | ORAL_TABLET | Freq: Two times a day (BID) | ORAL | 0 refills | Status: DC | PRN

## 2019-12-25 NOTE — Progress Notes (Signed)
   Subjective:  Documentation for virtual audio and video telecommunications through [place] encounter:  The patient was located at home. 2 patient identifiers used.  The provider was located in the office. The patient did consent to this visit and is aware of possible charges through their insurance for this visit.  The other persons participating in this telemedicine service were none. Time spent on call was 15 minutes and in review of previous records 20 minutes total.  This virtual service is not related to other E/M service within previous 7 days.   Patient ID: Tracy Holt, female    DOB: [birth year], 44 y.o.   MRN: [id number]  HPI Chief Complaint  Patient presents with  . aniexty    aniexty and stress- x 2 months   Complains of increased anxiety and stress.  She has been managing her anxiety until recently when her job situation changed and she is now permanently going to be in [place].  She is looking forward to this because that is where her husband lives and she has been commuting for a number of years.  She now has more responsibilities however and is under a great deal of stress with her work.  She has taken an SSRI in the past and would like to start back on medication for anxiety.  She is not in favor of counseling.  States she does not like to talk about her problems.  She denies self-medicating.  No SI or HI.  Physically she is doing well.  No fever, chills, dizziness, chest pain, palpitations, abdominal pain, nausea, vomiting, diarrhea.   Review of Systems Pertinent positives and negatives in the history of present illness.     Objective:   Physical Exam BP 122/70   Wt 156 lb (70.8 kg)   BMI 25.96 kg/m   Alert oriented in no acute distress.  Normal speech, mood and thought process.      Assessment & Plan:  Anxiety - Plan: escitalopram (LEXAPRO) 10 MG tablet, ALPRAZolam (XANAX) 0.25 MG tablet  Work stress  She will start 1/2 tablet of Lexapro  for the first week and then as long as she is doing well she will increase to the full tablet week 2.  Discussed potential side effects.  Discussed that this medication may take 4 to 6 weeks before full effectiveness. She may take alprazolam as needed for increased anxiety.  Discussed that this is short-term treatment only she will let me know if she decides to seek counseling. Follow up in 2 weeks via mychart or virtually.

## 2019-12-31 DIAGNOSIS — Z86010 Personal history of colonic polyps: Secondary | ICD-10-CM | POA: Diagnosis not present

## 2019-12-31 DIAGNOSIS — K5100 Ulcerative (chronic) pancolitis without complications: Secondary | ICD-10-CM | POA: Diagnosis not present

## 2020-01-11 ENCOUNTER — Encounter: Payer: Self-pay | Admitting: Family Medicine

## 2020-01-16 ENCOUNTER — Other Ambulatory Visit: Payer: Self-pay | Admitting: Family Medicine

## 2020-01-16 DIAGNOSIS — F419 Anxiety disorder, unspecified: Secondary | ICD-10-CM

## 2020-03-08 ENCOUNTER — Encounter: Payer: Self-pay | Admitting: Family Medicine

## 2020-03-19 ENCOUNTER — Other Ambulatory Visit: Payer: Self-pay | Admitting: Family Medicine

## 2020-03-19 DIAGNOSIS — F419 Anxiety disorder, unspecified: Secondary | ICD-10-CM

## 2020-03-31 NOTE — Progress Notes (Signed)
   Subjective:  Documentation for virtual audio and video telecommunications through [place] encounter:  The patient was located in her car. 2 patient identifiers used.  The provider was located in the office. The patient did consent to this visit and is aware of possible charges through their insurance for this visit.  The other persons participating in this telemedicine service were none. Time spent on call was 10 minutes and in review of previous records 15 minutes total.  This virtual service is not related to other E/M service within previous 7 days.   Patient ID: Tracy Holt, female    DOB: [birth year], 44 y.o.   MRN: [id number]  HPI Chief Complaint  Patient presents with  . med check    med check- wants to increase in dose   This is a follow up on anxiety.  She has been taking Lexapro 10 mg since December 25, 2019.   She has been doing well on this dose but states she still gets stressed and overwhelmed at times and would like to see if a higher dose of Lexapro would help her.   She is not in therapy or counseling and "is not a fan".   She saw GI - Dr. Candace Cruise in [place]. Is scheduled for colonoscopy.   Pap smear this year at her OB/GYN office.   Denies fever, chills, dizziness, chest pain, palpitations, shortness of breath, abdominal pain, N/V/D  Reviewed allergies, medications, past medical, surgical, family, and social history.   Review of Systems Pertinent positives and negatives in the history of present illness.     Objective:   Physical Exam BP 120/80   Pulse 67   Wt 160 lb (72.6 kg)   BMI 26.63 kg/m   Alert and oriented and in no acute distress.  Normal speech, mood and thought process.      Assessment & Plan:  Anxiety - Plan: escitalopram (LEXAPRO) 20 MG tablet  She has been doing fine on Lexapro 10 mg but still having issues with stress and getting overwhelmed at times.  We will increase her dose to 20 mg and she will let me know how she is doing in  a couple of weeks.  Discussed that we had not done a physical or check blood work in over a year.  She will call and schedule a fasting CPE.  Recommend she do this in the next 3 months.  She does see GI and OB/GYN.

## 2020-04-01 ENCOUNTER — Telehealth: Payer: BC Managed Care – PPO | Admitting: Family Medicine

## 2020-04-01 ENCOUNTER — Other Ambulatory Visit: Payer: Self-pay

## 2020-04-01 ENCOUNTER — Encounter: Payer: Self-pay | Admitting: Family Medicine

## 2020-04-01 VITALS — BP 120/80 | HR 67 | Wt 160.0 lb

## 2020-04-01 DIAGNOSIS — F419 Anxiety disorder, unspecified: Secondary | ICD-10-CM

## 2020-04-01 MED ORDER — ESCITALOPRAM OXALATE 20 MG PO TABS: 20.0000 mg | ORAL_TABLET | Freq: Every day | ORAL | 2 refills | Status: DC

## 2020-05-01 ENCOUNTER — Other Ambulatory Visit: Payer: Self-pay | Admitting: Family Medicine

## 2020-05-01 DIAGNOSIS — F419 Anxiety disorder, unspecified: Secondary | ICD-10-CM

## 2020-07-08 DIAGNOSIS — U071 COVID-19: Secondary | ICD-10-CM | POA: Diagnosis not present

## 2020-07-27 ENCOUNTER — Other Ambulatory Visit: Payer: Self-pay | Admitting: Family Medicine

## 2020-07-27 DIAGNOSIS — F419 Anxiety disorder, unspecified: Secondary | ICD-10-CM

## 2020-08-29 ENCOUNTER — Other Ambulatory Visit: Payer: Self-pay | Admitting: Family Medicine

## 2020-08-29 ENCOUNTER — Encounter: Payer: Self-pay | Admitting: Family Medicine

## 2020-08-29 DIAGNOSIS — R5383 Other fatigue: Secondary | ICD-10-CM | POA: Diagnosis not present

## 2020-08-29 DIAGNOSIS — R519 Headache, unspecified: Secondary | ICD-10-CM | POA: Diagnosis not present

## 2020-08-29 DIAGNOSIS — F419 Anxiety disorder, unspecified: Secondary | ICD-10-CM

## 2020-08-30 NOTE — Telephone Encounter (Signed)
Sent my chart message to advise of the need for an appt. [place]

## 2020-08-30 NOTE — Telephone Encounter (Signed)
[facility] is requesting to fill pt lexapeo for 90 days. Please advise. [place]

## 2020-08-30 NOTE — Telephone Encounter (Signed)
Okay to give 90 days but looks like she needs a visit.

## 2020-09-06 DIAGNOSIS — R072 Precordial pain: Secondary | ICD-10-CM | POA: Diagnosis not present

## 2020-09-06 DIAGNOSIS — R002 Palpitations: Secondary | ICD-10-CM | POA: Diagnosis not present

## 2020-10-05 DIAGNOSIS — I517 Cardiomegaly: Secondary | ICD-10-CM | POA: Diagnosis not present

## 2020-10-05 DIAGNOSIS — R0789 Other chest pain: Secondary | ICD-10-CM | POA: Diagnosis not present

## 2020-10-05 DIAGNOSIS — R072 Precordial pain: Secondary | ICD-10-CM | POA: Diagnosis not present

## 2020-11-30 ENCOUNTER — Encounter (HOSPITAL_COMMUNITY): Payer: Self-pay | Admitting: *Deleted

## 2020-12-07 ENCOUNTER — Other Ambulatory Visit: Payer: Self-pay | Admitting: Family Medicine

## 2020-12-07 DIAGNOSIS — F419 Anxiety disorder, unspecified: Secondary | ICD-10-CM

## 2020-12-08 ENCOUNTER — Telehealth (INDEPENDENT_AMBULATORY_CARE_PROVIDER_SITE_OTHER): Payer: BC Managed Care – PPO | Admitting: Family Medicine

## 2020-12-08 ENCOUNTER — Encounter: Payer: Self-pay | Admitting: Family Medicine

## 2020-12-08 VITALS — Wt 168.0 lb

## 2020-12-08 DIAGNOSIS — F419 Anxiety disorder, unspecified: Secondary | ICD-10-CM

## 2020-12-08 DIAGNOSIS — Z79899 Other long term (current) drug therapy: Secondary | ICD-10-CM | POA: Diagnosis not present

## 2020-12-08 MED ORDER — ESCITALOPRAM OXALATE 20 MG PO TABS: 1.0000 | ORAL_TABLET | Freq: Every day | ORAL | 1 refills | Status: AC

## 2020-12-08 NOTE — Progress Notes (Signed)
   Subjective:  Documentation for virtual audio and video telecommunications through [place] encounter:  The patient was located in her parked car. 2 patient identifiers used.  The provider was located in the office. The patient did consent to this visit and is aware of possible charges through their insurance for this visit.  The other persons participating in this telemedicine service were none. Time spent on call was 68mnutes and in review of previous records >20 minutes total.  This virtual service is not related to other E/M service within previous 7 days.   Patient ID: Tracy Holt female    DOB: [birth year] 45y.o.   MRN: [id number] HPI Chief Complaint  Patient presents with   med check    Med check, lexapro doing well   This is a medication management visit   She reports taking Lexapro daily and doing well. Anxiety under control   States she has been seeing a counselor weekly and it took her over 20 years to do this. She likes it.   States she took a new job and likes it a lot, less stress.   Has colonoscopy scheduled for next Tuesday   [facility] for palpitations. States [place] Had labs, Echo, EKG, stress test and all fine per patient.   Denies fever, chills, dizziness, chest pain, palpitations, shortness of breath, abdominal pain, N/V/D, urinary symptoms, LE edema.     Review of Systems Pertinent positives and negatives in the history of present illness.     Objective:   Physical Exam Wt 168 lb (76.2 kg)   BMI 27.96 kg/m   Unit in no acute distress.  Respirations unlabored.  Her mood is good.      Assessment & Plan:  Anxiety - Plan: escitalopram (LEXAPRO) 20 MG tablet  Medication management  She is doing quite well on Lexapro and now involved in counseling.  Congratulated her on taking good care of her self.  Her new job is also made her life less stressful.  Reviewed  documentation from previous visit and work-up for palpitations.  Colonoscopy scheduled for next week.  She will schedule with her OB/GYN.  Refilled medication for 6 months.  Encouraged her to come in the office sometime in the next 6 months for a CPE

## 2020-12-14 DIAGNOSIS — Z1211 Encounter for screening for malignant neoplasm of colon: Secondary | ICD-10-CM | POA: Diagnosis not present

## 2020-12-14 DIAGNOSIS — F419 Anxiety disorder, unspecified: Secondary | ICD-10-CM | POA: Diagnosis not present

## 2020-12-14 DIAGNOSIS — Z86010 Personal history of colonic polyps: Secondary | ICD-10-CM | POA: Diagnosis not present

## 2020-12-14 DIAGNOSIS — Z13818 Encounter for screening for other digestive system disorders: Secondary | ICD-10-CM | POA: Diagnosis not present

## 2020-12-14 DIAGNOSIS — K5100 Ulcerative (chronic) pancolitis without complications: Secondary | ICD-10-CM | POA: Diagnosis not present

## 2020-12-14 DIAGNOSIS — K6289 Other specified diseases of anus and rectum: Secondary | ICD-10-CM | POA: Diagnosis not present

## 2020-12-14 DIAGNOSIS — G4733 Obstructive sleep apnea (adult) (pediatric): Secondary | ICD-10-CM | POA: Diagnosis not present

## 2021-02-20 ENCOUNTER — Encounter: Payer: Self-pay | Admitting: Family Medicine

## 2021-02-24 ENCOUNTER — Telehealth (INDEPENDENT_AMBULATORY_CARE_PROVIDER_SITE_OTHER): Payer: BC Managed Care – PPO | Admitting: Family Medicine

## 2021-02-24 ENCOUNTER — Encounter: Payer: Self-pay | Admitting: Family Medicine

## 2021-02-24 ENCOUNTER — Other Ambulatory Visit: Payer: Self-pay

## 2021-02-24 VITALS — BP 122/80 | Ht 65.5 in | Wt 178.0 lb

## 2021-02-24 DIAGNOSIS — R635 Abnormal weight gain: Secondary | ICD-10-CM | POA: Diagnosis not present

## 2021-02-24 DIAGNOSIS — F419 Anxiety disorder, unspecified: Secondary | ICD-10-CM | POA: Diagnosis not present

## 2021-02-24 NOTE — Progress Notes (Signed)
   Subjective:  Documentation for virtual audio and video telecommunications through [place] encounter:  The patient was located at work. 2 patient identifiers used.  The provider was located in the office. The patient did consent to this visit and is aware of possible charges through their insurance for this visit.  The other persons participating in this telemedicine service were none. Time spent on call was 12 minutes and in review of previous records >12 minutes total.  This virtual service is not related to other E/M service within previous 7 days.   Patient ID: Tracy Holt, female    DOB: [birth year], 45 y.o.   MRN: [id number]  HPI Chief Complaint  Patient presents with   other    Discuss medications wegovy and saxenda   She would like to discuss starting on a weight loss medication, long-term, such as [place] or Saxenda.  States she has gotten into her previous bad habits and has gained a significant amount of weight.  She did have weight loss surgery and was eating healthy diet and exercising. States she is in a good place emotionally and is enjoying her job.  States she goes to a new church which she likes a lot.  Denies any personal or family history of thyroid cancer.  No history of pancreatitis.  History of uterine ablation.  Denies fever, chills, dizziness, chest pain, palpitations, shortness of breath, abdominal pain, N/V/D, urinary symptoms, LE edema.   Reviewed allergies, medications, past medical, surgical, family, and social history.    Review of Systems Pertinent positives and negatives in the history of present illness.     Objective:   Physical Exam BP 122/80   Ht 5' 5.5" (1.664 m)   Wt 178 lb (80.7 kg)   BMI 29.17 kg/m   Alert and oriented in no acute distress      Assessment & Plan:  Weight gain  Anxiety  Discussed difference between weight loss medications Saxenda and Wegovy and that she currently does not meet criteria based on her BMI  today. She will come to the office for an evaluation, weight check and labs and we will further discuss option for weight loss.

## 2021-03-07 ENCOUNTER — Encounter: Payer: Self-pay | Admitting: Family Medicine

## 2021-03-07 ENCOUNTER — Other Ambulatory Visit: Payer: Self-pay

## 2021-03-07 ENCOUNTER — Ambulatory Visit (INDEPENDENT_AMBULATORY_CARE_PROVIDER_SITE_OTHER): Payer: BC Managed Care – PPO | Admitting: Family Medicine

## 2021-03-07 VITALS — BP 142/86 | HR 70 | Temp 98.0°F | Ht 65.5 in | Wt 182.0 lb

## 2021-03-07 DIAGNOSIS — R635 Abnormal weight gain: Secondary | ICD-10-CM

## 2021-03-07 NOTE — Progress Notes (Signed)
   Subjective:    Patient ID: Tracy Holt, female    DOB: [birth year], 45 y.o.   MRN: [id number]  HPI Chief Complaint  Patient presents with   Follow-up    Recheck and labs   She is here due to recent weight gain and increased appetite requesting weight loss medication.  History of weight loss surgery.  States she has tried intermittent fasting, other dieting methods and has not recently been exercising.  Denies fever, chills, dizziness, chest pain, palpitations, shortness of breath, abdominal pain, N/V/D, urinary symptoms, LE edema.   Reviewed allergies, medications, past medical, surgical, family, and social history.    Review of Systems Pertinent positives and negatives in the history of present illness.     Objective:   Physical Exam BP (!) 142/86 (BP Location: Left Arm, Patient Position: Sitting, Cuff Size: Normal)   Pulse 70   Temp 98 F (36.7 C) (Oral)   Ht 5' 5.5" (1.664 m)   Wt 182 lb (82.6 kg)   SpO2 96%   BMI 29.83 kg/m   Alert and in no distress. Cardiac exam shows a regular sinus rhythm without murmurs or gallops. Lungs are clear to auscultation.  Is without edema.  Skin is warm and dry.  Normal mood.        Assessment & Plan:  Weight gain - Plan: CBC with Differential/Platelet, Comprehensive metabolic panel, TSH, T4, free, T3  Weight loss counseling done.  She is aware of numerous methods.  She plans to start exercising again which is helped in the past.  Discussed that her BMI is less than 30 and she does not technically meet criteria to start on weight loss medication.  She is okay with this. Follow-up pending lab results

## 2021-03-08 LAB — CBC WITH DIFFERENTIAL/PLATELET
Basophils Absolute: 0 10*3/uL (ref 0.0–0.2)
Basos: 1 %
EOS (ABSOLUTE): 0.1 10*3/uL (ref 0.0–0.4)
Eos: 1 %
Hematocrit: 36.3 % (ref 34.0–46.6)
Hemoglobin: 11.3 g/dL (ref 11.1–15.9)
Immature Grans (Abs): 0 10*3/uL (ref 0.0–0.1)
Immature Granulocytes: 0 %
Lymphocytes Absolute: 1.6 10*3/uL (ref 0.7–3.1)
Lymphs: 34 %
MCH: 26.8 pg (ref 26.6–33.0)
MCHC: 31.1 g/dL — ABNORMAL LOW (ref 31.5–35.7)
MCV: 86 fL (ref 79–97)
Monocytes Absolute: 0.5 10*3/uL (ref 0.1–0.9)
Monocytes: 10 %
Neutrophils Absolute: 2.5 10*3/uL (ref 1.4–7.0)
Neutrophils: 54 %
Platelets: 270 10*3/uL (ref 150–450)
RBC: 4.22 x10E6/uL (ref 3.77–5.28)
RDW: 14.2 % (ref 11.7–15.4)
WBC: 4.6 10*3/uL (ref 3.4–10.8)

## 2021-03-08 LAB — T3: T3, Total: 107 ng/dL (ref 71–180)

## 2021-03-08 LAB — T4, FREE: Free T4: 1.04 ng/dL (ref 0.82–1.77)

## 2021-03-08 LAB — COMPREHENSIVE METABOLIC PANEL
ALT: 9 IU/L (ref 0–32)
AST: 16 IU/L (ref 0–40)
Albumin/Globulin Ratio: 2 (ref 1.2–2.2)
Albumin: 4.5 g/dL (ref 3.8–4.8)
Alkaline Phosphatase: 69 IU/L (ref 44–121)
BUN/Creatinine Ratio: 17 (ref 9–23)
BUN: 12 mg/dL (ref 6–24)
Bilirubin Total: 0.3 mg/dL (ref 0.0–1.2)
CO2: 23 mmol/L (ref 20–29)
Calcium: 9.3 mg/dL (ref 8.7–10.2)
Chloride: 103 mmol/L (ref 96–106)
Creatinine, Ser: 0.7 mg/dL (ref 0.57–1.00)
Globulin, Total: 2.2 g/dL (ref 1.5–4.5)
Glucose: 95 mg/dL (ref 65–99)
Potassium: 4.2 mmol/L (ref 3.5–5.2)
Sodium: 140 mmol/L (ref 134–144)
Total Protein: 6.7 g/dL (ref 6.0–8.5)
eGFR: 109 mL/min/{1.73_m2} (ref >59)

## 2021-03-08 LAB — TSH: TSH: 1.31 u[IU]/mL (ref 0.450–4.500)

## 2021-03-31 DIAGNOSIS — F419 Anxiety disorder, unspecified: Secondary | ICD-10-CM | POA: Diagnosis not present

## 2021-03-31 DIAGNOSIS — Z6829 Body mass index (BMI) 29.0-29.9, adult: Secondary | ICD-10-CM | POA: Diagnosis not present

## 2021-03-31 DIAGNOSIS — E663 Overweight: Secondary | ICD-10-CM | POA: Diagnosis not present

## 2021-04-23 IMAGING — MG DIGITAL SCREENING BILAT W/ TOMO W/ CAD
8 series · 9 of 24 positions shown · non-contrast
Comparison: Previous exam(s).

CLINICAL DATA: Screening.

EXAM:
DIGITAL SCREENING BILATERAL MAMMOGRAM WITH TOMO AND CAD

[L MLO synth-2D]
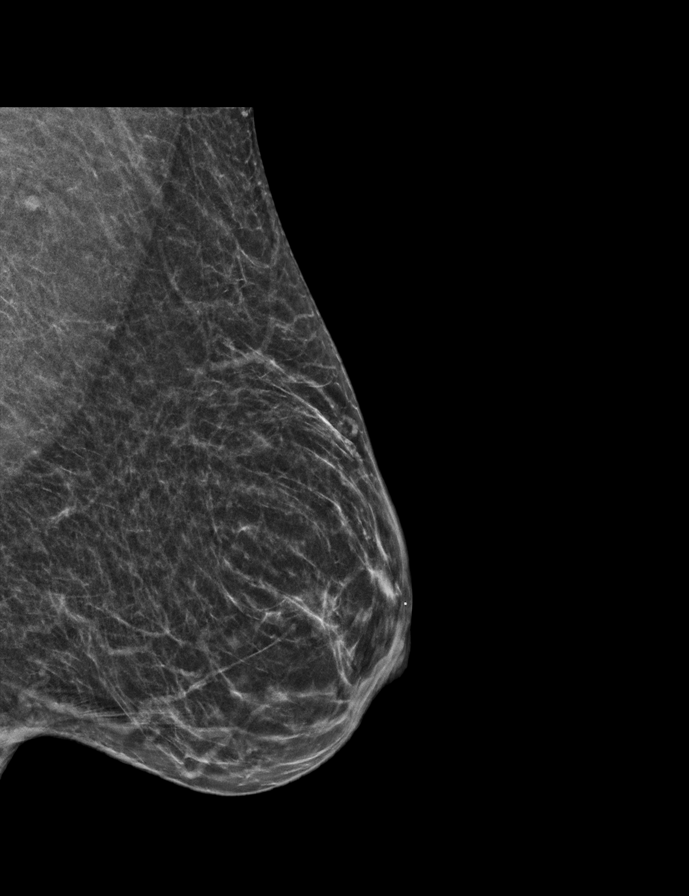

[R CC synth-2D]
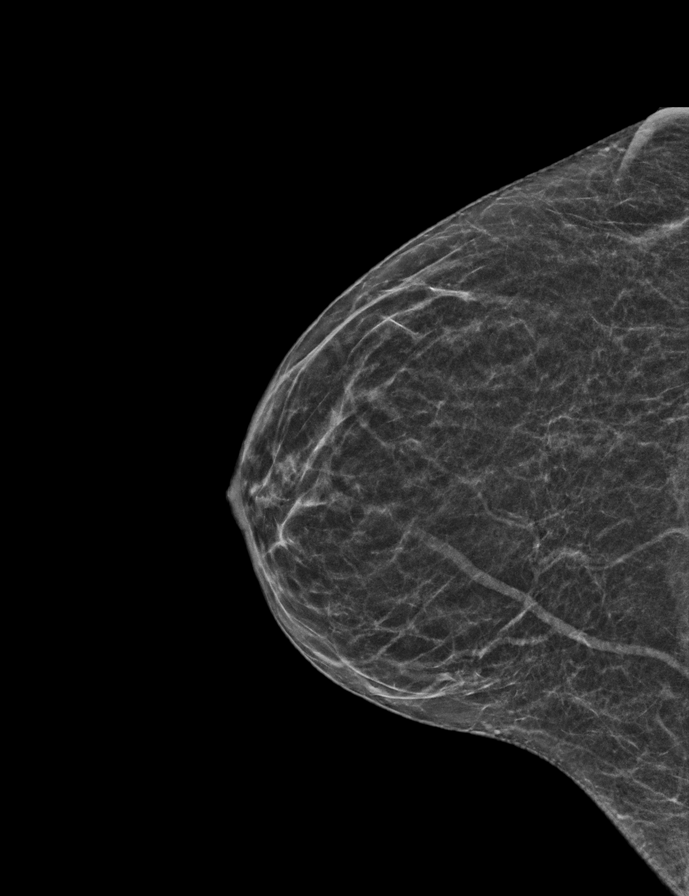

[L CC synth-2D]
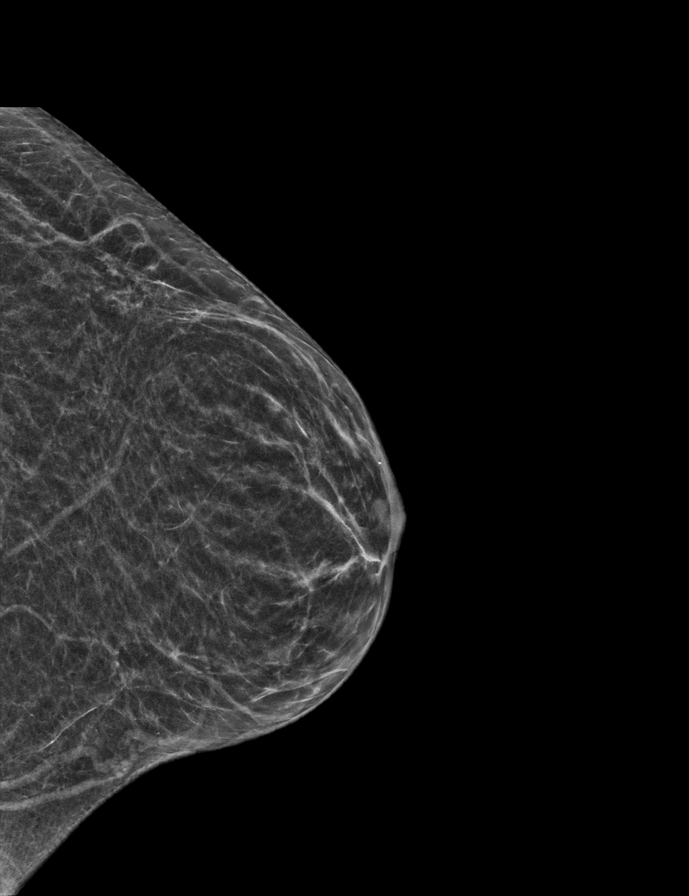

[R MLO synth-2D]
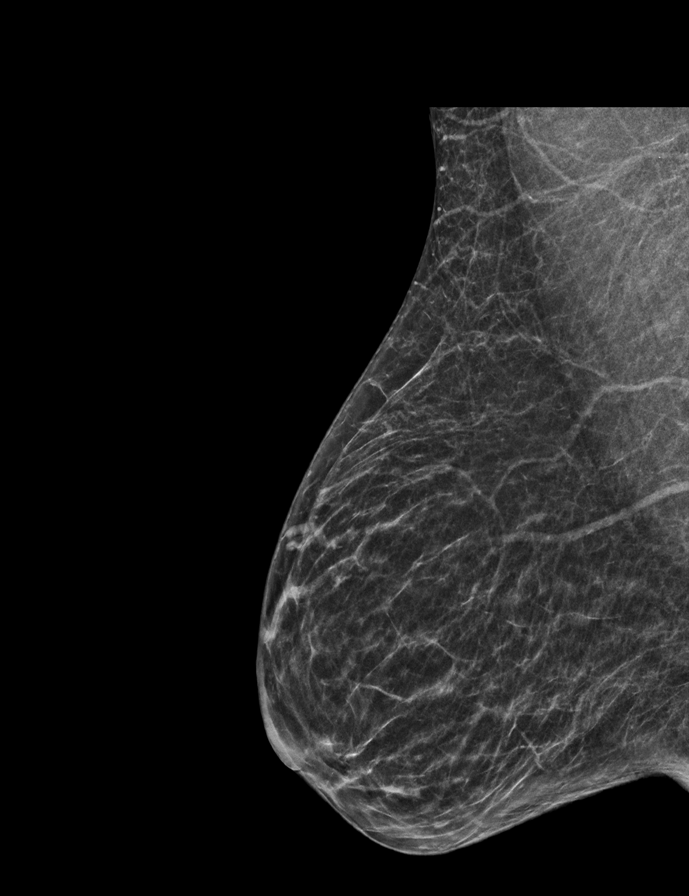

[R MLO tomo · 2 of 47 frames shown]
[frame 16/47]
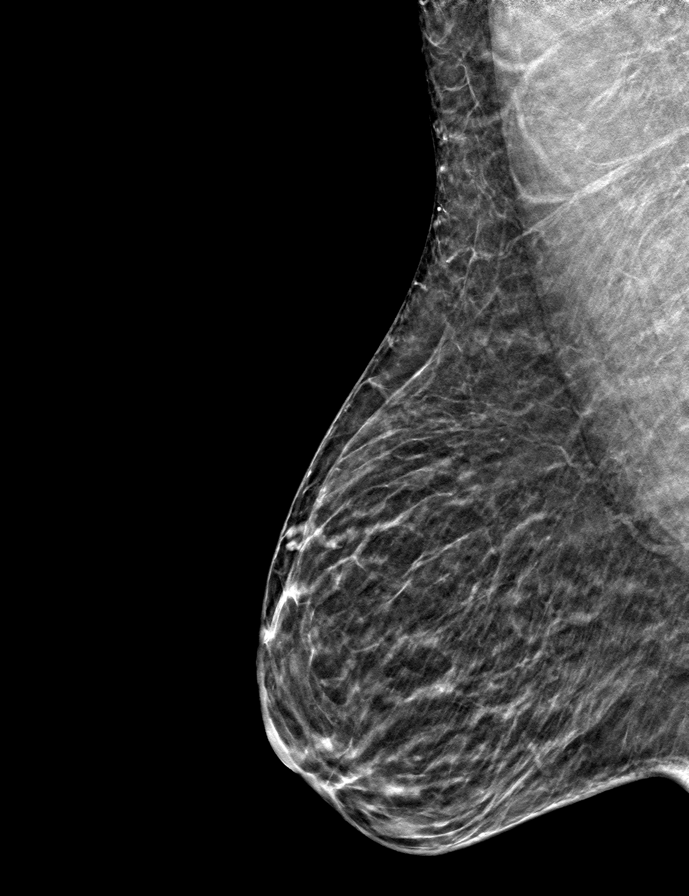
[frame 24/47]
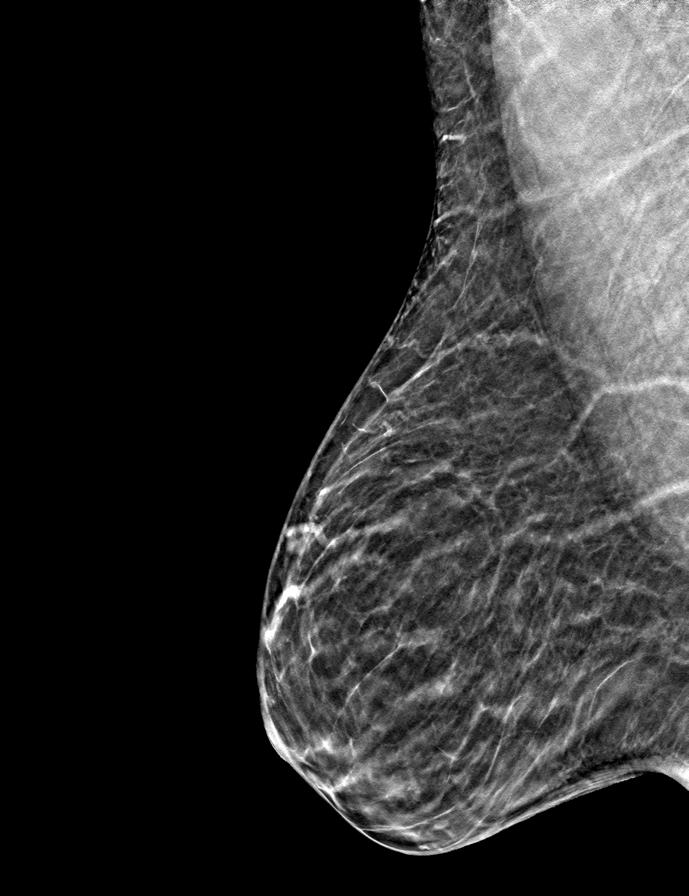

[L MLO tomo · tomo slice 25/48.0]
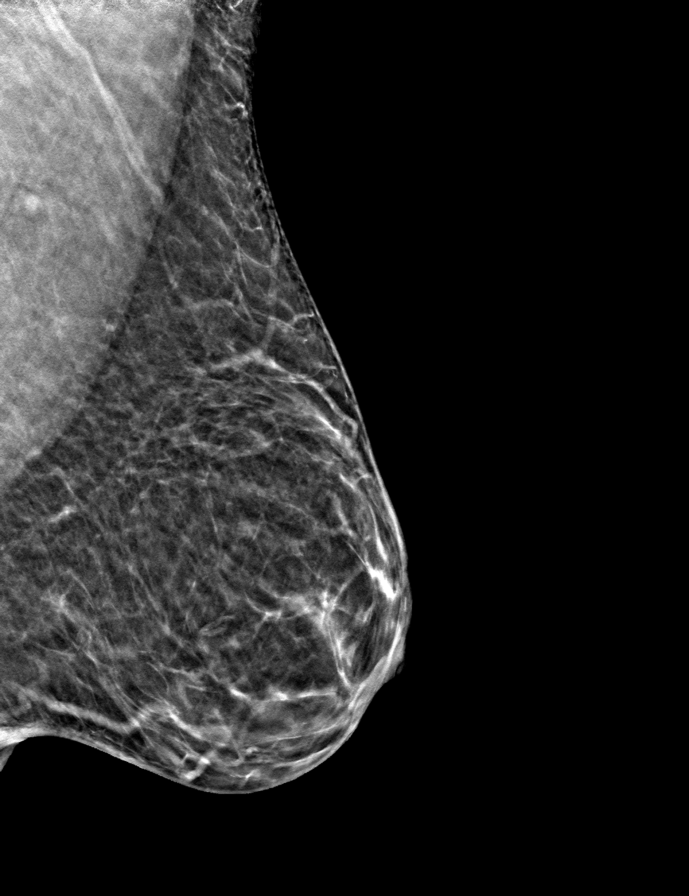

[L CC tomo · tomo slice 22/43.0]
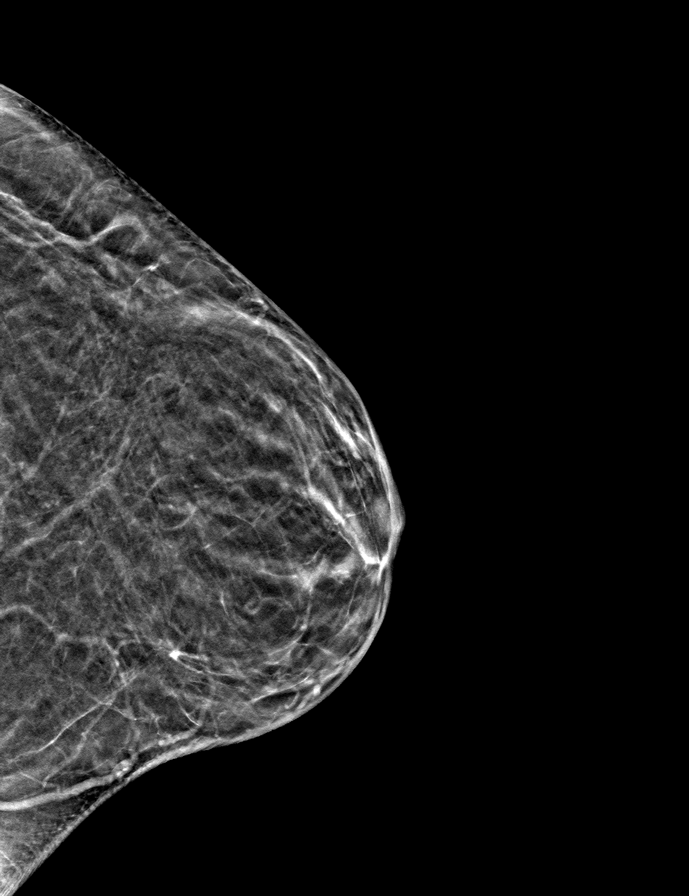

[R CC tomo · tomo slice 22/43.0]
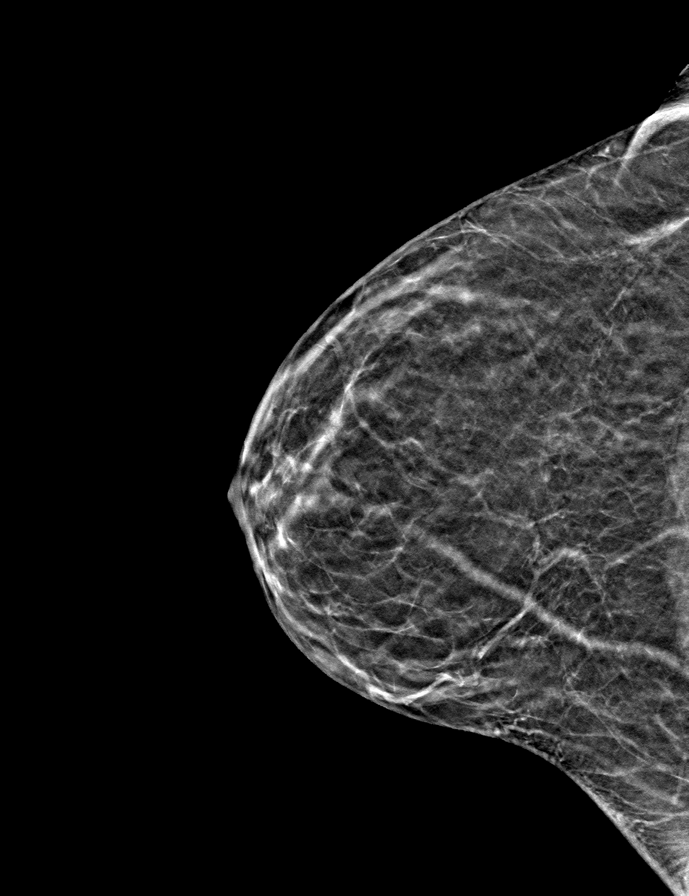

[9 of 24 positions shown; findings below may reference images not displayed]

ACR Breast Density Category b: There are scattered areas of
fibroglandular density.
FINDINGS: There are no findings suspicious for malignancy. Images were
processed with CAD.
IMPRESSION: No mammographic evidence of malignancy. A result letter of this
screening mammogram will be mailed directly to the patient.

RECOMMENDATION:
Screening mammogram in one year. (Code:[id number])

BI-RADS CATEGORY  1: Negative.

## 2021-05-02 DIAGNOSIS — E663 Overweight: Secondary | ICD-10-CM | POA: Diagnosis not present

## 2021-05-02 DIAGNOSIS — F419 Anxiety disorder, unspecified: Secondary | ICD-10-CM | POA: Diagnosis not present

## 2021-05-25 DIAGNOSIS — Z1231 Encounter for screening mammogram for malignant neoplasm of breast: Secondary | ICD-10-CM | POA: Diagnosis not present

## 2021-08-02 DIAGNOSIS — E663 Overweight: Secondary | ICD-10-CM | POA: Diagnosis not present

## 2021-08-02 DIAGNOSIS — F419 Anxiety disorder, unspecified: Secondary | ICD-10-CM | POA: Diagnosis not present

## 2021-08-02 DIAGNOSIS — K5190 Ulcerative colitis, unspecified, without complications: Secondary | ICD-10-CM | POA: Diagnosis not present

## 2021-09-19 DIAGNOSIS — R194 Change in bowel habit: Secondary | ICD-10-CM | POA: Diagnosis not present

## 2021-09-19 DIAGNOSIS — K5100 Ulcerative (chronic) pancolitis without complications: Secondary | ICD-10-CM | POA: Diagnosis not present

## 2021-09-28 DIAGNOSIS — J029 Acute pharyngitis, unspecified: Secondary | ICD-10-CM | POA: Diagnosis not present

## 2021-09-28 DIAGNOSIS — Z20822 Contact with and (suspected) exposure to covid-19: Secondary | ICD-10-CM | POA: Diagnosis not present

## 2021-12-02 DIAGNOSIS — F1020 Alcohol dependence, uncomplicated: Secondary | ICD-10-CM | POA: Diagnosis not present

## 2021-12-02 DIAGNOSIS — F411 Generalized anxiety disorder: Secondary | ICD-10-CM | POA: Diagnosis not present

## 2021-12-09 DIAGNOSIS — F1020 Alcohol dependence, uncomplicated: Secondary | ICD-10-CM | POA: Diagnosis not present

## 2021-12-09 DIAGNOSIS — F411 Generalized anxiety disorder: Secondary | ICD-10-CM | POA: Diagnosis not present

## 2021-12-16 DIAGNOSIS — F1020 Alcohol dependence, uncomplicated: Secondary | ICD-10-CM | POA: Diagnosis not present

## 2021-12-16 DIAGNOSIS — F411 Generalized anxiety disorder: Secondary | ICD-10-CM | POA: Diagnosis not present

## 2021-12-20 DIAGNOSIS — D125 Benign neoplasm of sigmoid colon: Secondary | ICD-10-CM | POA: Diagnosis not present

## 2021-12-20 DIAGNOSIS — K5100 Ulcerative (chronic) pancolitis without complications: Secondary | ICD-10-CM | POA: Diagnosis not present

## 2021-12-20 DIAGNOSIS — K6289 Other specified diseases of anus and rectum: Secondary | ICD-10-CM | POA: Diagnosis not present

## 2021-12-20 DIAGNOSIS — F419 Anxiety disorder, unspecified: Secondary | ICD-10-CM | POA: Diagnosis not present

## 2021-12-20 DIAGNOSIS — G4730 Sleep apnea, unspecified: Secondary | ICD-10-CM | POA: Diagnosis not present

## 2021-12-20 DIAGNOSIS — K5140 Inflammatory polyps of colon without complications: Secondary | ICD-10-CM | POA: Diagnosis not present

## 2021-12-21 DIAGNOSIS — F1020 Alcohol dependence, uncomplicated: Secondary | ICD-10-CM | POA: Diagnosis not present

## 2021-12-21 DIAGNOSIS — F411 Generalized anxiety disorder: Secondary | ICD-10-CM | POA: Diagnosis not present

## 2021-12-30 DIAGNOSIS — F1020 Alcohol dependence, uncomplicated: Secondary | ICD-10-CM | POA: Diagnosis not present

## 2021-12-30 DIAGNOSIS — F411 Generalized anxiety disorder: Secondary | ICD-10-CM | POA: Diagnosis not present

## 2022-01-06 DIAGNOSIS — F1020 Alcohol dependence, uncomplicated: Secondary | ICD-10-CM | POA: Diagnosis not present

## 2022-01-06 DIAGNOSIS — F411 Generalized anxiety disorder: Secondary | ICD-10-CM | POA: Diagnosis not present

## 2022-01-11 DIAGNOSIS — F1020 Alcohol dependence, uncomplicated: Secondary | ICD-10-CM | POA: Diagnosis not present

## 2022-01-11 DIAGNOSIS — F411 Generalized anxiety disorder: Secondary | ICD-10-CM | POA: Diagnosis not present

## 2022-01-12 DIAGNOSIS — K5100 Ulcerative (chronic) pancolitis without complications: Secondary | ICD-10-CM | POA: Diagnosis not present

## 2022-01-12 DIAGNOSIS — M1990 Unspecified osteoarthritis, unspecified site: Secondary | ICD-10-CM | POA: Diagnosis not present

## 2022-01-12 DIAGNOSIS — E6609 Other obesity due to excess calories: Secondary | ICD-10-CM | POA: Diagnosis not present

## 2022-01-12 DIAGNOSIS — L2084 Intrinsic (allergic) eczema: Secondary | ICD-10-CM | POA: Diagnosis not present

## 2022-01-12 DIAGNOSIS — Z6833 Body mass index (BMI) 33.0-33.9, adult: Secondary | ICD-10-CM | POA: Diagnosis not present

## 2022-01-13 DIAGNOSIS — F1020 Alcohol dependence, uncomplicated: Secondary | ICD-10-CM | POA: Diagnosis not present

## 2022-01-13 DIAGNOSIS — F411 Generalized anxiety disorder: Secondary | ICD-10-CM | POA: Diagnosis not present

## 2022-01-27 DIAGNOSIS — F411 Generalized anxiety disorder: Secondary | ICD-10-CM | POA: Diagnosis not present

## 2022-01-27 DIAGNOSIS — F1020 Alcohol dependence, uncomplicated: Secondary | ICD-10-CM | POA: Diagnosis not present

## 2022-01-31 DIAGNOSIS — M2550 Pain in unspecified joint: Secondary | ICD-10-CM | POA: Diagnosis not present

## 2022-01-31 DIAGNOSIS — M47817 Spondylosis without myelopathy or radiculopathy, lumbosacral region: Secondary | ICD-10-CM | POA: Diagnosis not present

## 2022-01-31 DIAGNOSIS — M79642 Pain in left hand: Secondary | ICD-10-CM | POA: Diagnosis not present

## 2022-01-31 DIAGNOSIS — M19042 Primary osteoarthritis, left hand: Secondary | ICD-10-CM | POA: Diagnosis not present

## 2022-01-31 DIAGNOSIS — M19041 Primary osteoarthritis, right hand: Secondary | ICD-10-CM | POA: Diagnosis not present

## 2022-01-31 DIAGNOSIS — Z6833 Body mass index (BMI) 33.0-33.9, adult: Secondary | ICD-10-CM | POA: Diagnosis not present

## 2022-01-31 DIAGNOSIS — M79671 Pain in right foot: Secondary | ICD-10-CM | POA: Diagnosis not present

## 2022-01-31 DIAGNOSIS — M79672 Pain in left foot: Secondary | ICD-10-CM | POA: Diagnosis not present

## 2022-01-31 DIAGNOSIS — M545 Low back pain, unspecified: Secondary | ICD-10-CM | POA: Diagnosis not present

## 2022-01-31 DIAGNOSIS — E669 Obesity, unspecified: Secondary | ICD-10-CM | POA: Diagnosis not present

## 2022-01-31 DIAGNOSIS — M79641 Pain in right hand: Secondary | ICD-10-CM | POA: Diagnosis not present

## 2022-01-31 DIAGNOSIS — M461 Sacroiliitis, not elsewhere classified: Secondary | ICD-10-CM | POA: Diagnosis not present

## 2022-02-03 DIAGNOSIS — F1020 Alcohol dependence, uncomplicated: Secondary | ICD-10-CM | POA: Diagnosis not present

## 2022-02-03 DIAGNOSIS — F411 Generalized anxiety disorder: Secondary | ICD-10-CM | POA: Diagnosis not present

## 2022-02-10 DIAGNOSIS — F411 Generalized anxiety disorder: Secondary | ICD-10-CM | POA: Diagnosis not present

## 2022-02-10 DIAGNOSIS — F1020 Alcohol dependence, uncomplicated: Secondary | ICD-10-CM | POA: Diagnosis not present

## 2022-02-17 DIAGNOSIS — F411 Generalized anxiety disorder: Secondary | ICD-10-CM | POA: Diagnosis not present

## 2022-02-17 DIAGNOSIS — F1020 Alcohol dependence, uncomplicated: Secondary | ICD-10-CM | POA: Diagnosis not present

## 2022-02-27 DIAGNOSIS — F411 Generalized anxiety disorder: Secondary | ICD-10-CM | POA: Diagnosis not present

## 2022-02-27 DIAGNOSIS — F1020 Alcohol dependence, uncomplicated: Secondary | ICD-10-CM | POA: Diagnosis not present

## 2022-03-16 DIAGNOSIS — G4733 Obstructive sleep apnea (adult) (pediatric): Secondary | ICD-10-CM | POA: Diagnosis not present

## 2022-03-16 DIAGNOSIS — F419 Anxiety disorder, unspecified: Secondary | ICD-10-CM | POA: Diagnosis not present

## 2022-03-16 DIAGNOSIS — K5190 Ulcerative colitis, unspecified, without complications: Secondary | ICD-10-CM | POA: Diagnosis not present

## 2022-03-16 DIAGNOSIS — Z79899 Other long term (current) drug therapy: Secondary | ICD-10-CM | POA: Diagnosis not present

## 2022-03-16 DIAGNOSIS — R1032 Left lower quadrant pain: Secondary | ICD-10-CM | POA: Diagnosis not present

## 2022-03-16 DIAGNOSIS — R1030 Lower abdominal pain, unspecified: Secondary | ICD-10-CM | POA: Diagnosis not present

## 2022-03-22 DIAGNOSIS — R1032 Left lower quadrant pain: Secondary | ICD-10-CM | POA: Diagnosis not present

## 2022-03-22 DIAGNOSIS — K5100 Ulcerative (chronic) pancolitis without complications: Secondary | ICD-10-CM | POA: Diagnosis not present

## 2022-04-04 DIAGNOSIS — F1020 Alcohol dependence, uncomplicated: Secondary | ICD-10-CM | POA: Diagnosis not present

## 2022-04-04 DIAGNOSIS — F411 Generalized anxiety disorder: Secondary | ICD-10-CM | POA: Diagnosis not present

## 2022-04-07 DIAGNOSIS — F1020 Alcohol dependence, uncomplicated: Secondary | ICD-10-CM | POA: Diagnosis not present

## 2022-04-07 DIAGNOSIS — F411 Generalized anxiety disorder: Secondary | ICD-10-CM | POA: Diagnosis not present

## 2022-04-17 DIAGNOSIS — F1020 Alcohol dependence, uncomplicated: Secondary | ICD-10-CM | POA: Diagnosis not present

## 2022-04-17 DIAGNOSIS — F411 Generalized anxiety disorder: Secondary | ICD-10-CM | POA: Diagnosis not present

## 2022-05-05 DIAGNOSIS — R059 Cough, unspecified: Secondary | ICD-10-CM | POA: Diagnosis not present

## 2022-05-05 DIAGNOSIS — J40 Bronchitis, not specified as acute or chronic: Secondary | ICD-10-CM | POA: Diagnosis not present
